# Patient Record
Sex: Female | Born: 1935 | Race: White | Hispanic: No | Smoking: Never smoker
Health system: Southern US, Community
[De-identification: ages and names within clinical notes are randomized; demographics above are authoritative.]

## PROBLEM LIST (undated history)

## (undated) DIAGNOSIS — I10 Essential (primary) hypertension: Secondary | ICD-10-CM

---

## 2004-07-10 ENCOUNTER — Ambulatory Visit: Payer: Self-pay | Admitting: Internal Medicine

## 2005-07-10 ENCOUNTER — Ambulatory Visit: Payer: Self-pay | Admitting: Internal Medicine

## 2005-07-16 ENCOUNTER — Ambulatory Visit: Payer: Self-pay | Admitting: Internal Medicine

## 2005-07-18 ENCOUNTER — Ambulatory Visit: Payer: Self-pay | Admitting: Internal Medicine

## 2006-10-10 ENCOUNTER — Ambulatory Visit: Payer: Self-pay | Admitting: Internal Medicine

## 2007-06-12 HISTORY — PX: REVISION TOTAL HIP ARTHROPLASTY: SHX766

## 2007-07-03 ENCOUNTER — Ambulatory Visit: Payer: Self-pay | Admitting: Internal Medicine

## 2007-07-08 ENCOUNTER — Ambulatory Visit: Payer: Self-pay | Admitting: Internal Medicine

## 2008-04-13 ENCOUNTER — Ambulatory Visit: Payer: Self-pay | Admitting: Family Medicine

## 2008-07-05 ENCOUNTER — Ambulatory Visit: Payer: Self-pay | Admitting: Internal Medicine

## 2009-08-25 ENCOUNTER — Ambulatory Visit: Payer: Self-pay | Admitting: Internal Medicine

## 2011-12-23 ENCOUNTER — Inpatient Hospital Stay: Payer: Self-pay | Admitting: Physician Assistant

## 2011-12-23 ENCOUNTER — Ambulatory Visit: Payer: Self-pay | Admitting: Unknown Physician Specialty

## 2011-12-23 LAB — CBC WITH DIFFERENTIAL/PLATELET
Basophil #: 0.1 10*3/uL (ref 0.0–0.1)
Basophil %: 0.9 %
Eosinophil #: 0.1 10*3/uL (ref 0.0–0.7)
Eosinophil %: 0.8 %
HGB: 13 g/dL (ref 12.0–16.0)
Lymphocyte %: 12.6 %
MCH: 30.7 pg (ref 26.0–34.0)
MCHC: 33.4 g/dL (ref 32.0–36.0)
MCV: 92 fL (ref 80–100)
Monocyte #: 0.5 x10 3/mm (ref 0.2–0.9)
Neutrophil #: 5.4 10*3/uL (ref 1.4–6.5)
Neutrophil %: 78.5 %
RBC: 4.24 10*6/uL (ref 3.80–5.20)

## 2011-12-23 LAB — BASIC METABOLIC PANEL WITH GFR
Anion Gap: 10
BUN: 20 mg/dL — ABNORMAL HIGH
Calcium, Total: 9.1 mg/dL
Chloride: 100 mmol/L
Co2: 27 mmol/L
Creatinine: 1.01 mg/dL
EGFR (African American): >60
EGFR (Non-African Amer.): 52 — ABNORMAL LOW
Glucose: 128 mg/dL — ABNORMAL HIGH
Osmolality: 278
Potassium: 3.7 mmol/L
Sodium: 137 mmol/L

## 2011-12-24 LAB — HEMOGLOBIN: HGB: 10.4 g/dL — ABNORMAL LOW (ref 12.0–16.0)

## 2011-12-25 LAB — BASIC METABOLIC PANEL
BUN: 17 mg/dL (ref 7–18)
Calcium, Total: 7.7 mg/dL — ABNORMAL LOW (ref 8.5–10.1)
Chloride: 103 mmol/L (ref 98–107)
Creatinine: 0.79 mg/dL (ref 0.60–1.30)
EGFR (African American): >60
EGFR (Non-African Amer.): >60

## 2011-12-25 LAB — HEMOGLOBIN: HGB: 9.3 g/dL — ABNORMAL LOW (ref 12.0–16.0)

## 2011-12-26 LAB — URINALYSIS, COMPLETE
Glucose,UR: NEGATIVE mg/dL (ref 0–75)
Nitrite: NEGATIVE
Protein: 30
RBC,UR: 2 /HPF (ref 0–5)
WBC UR: 4 /HPF (ref 0–5)

## 2011-12-26 LAB — HEMOGLOBIN: HGB: 9.5 g/dL — ABNORMAL LOW (ref 12.0–16.0)

## 2011-12-26 LAB — PATHOLOGY REPORT

## 2012-02-14 ENCOUNTER — Ambulatory Visit: Payer: Medicare Other | Admitting: Family Medicine

## 2013-10-19 ENCOUNTER — Ambulatory Visit: Payer: Self-pay | Admitting: Unknown Physician Specialty

## 2014-04-12 IMAGING — CR PELVIS - 1-2 VIEW
1 series · 1 of 1 positions shown · non-contrast
Comparison: none

REASON FOR EXAM: fall
COMMENTS:

PROCEDURE:     DXR - DXR PELVIS AP ONLY  - December 23, 2011  [DATE]
RESULT:
An impacted subcapital femoral neck fracture is identified. Distal fracture
fragment demonstrates superior displacement.

[ap]
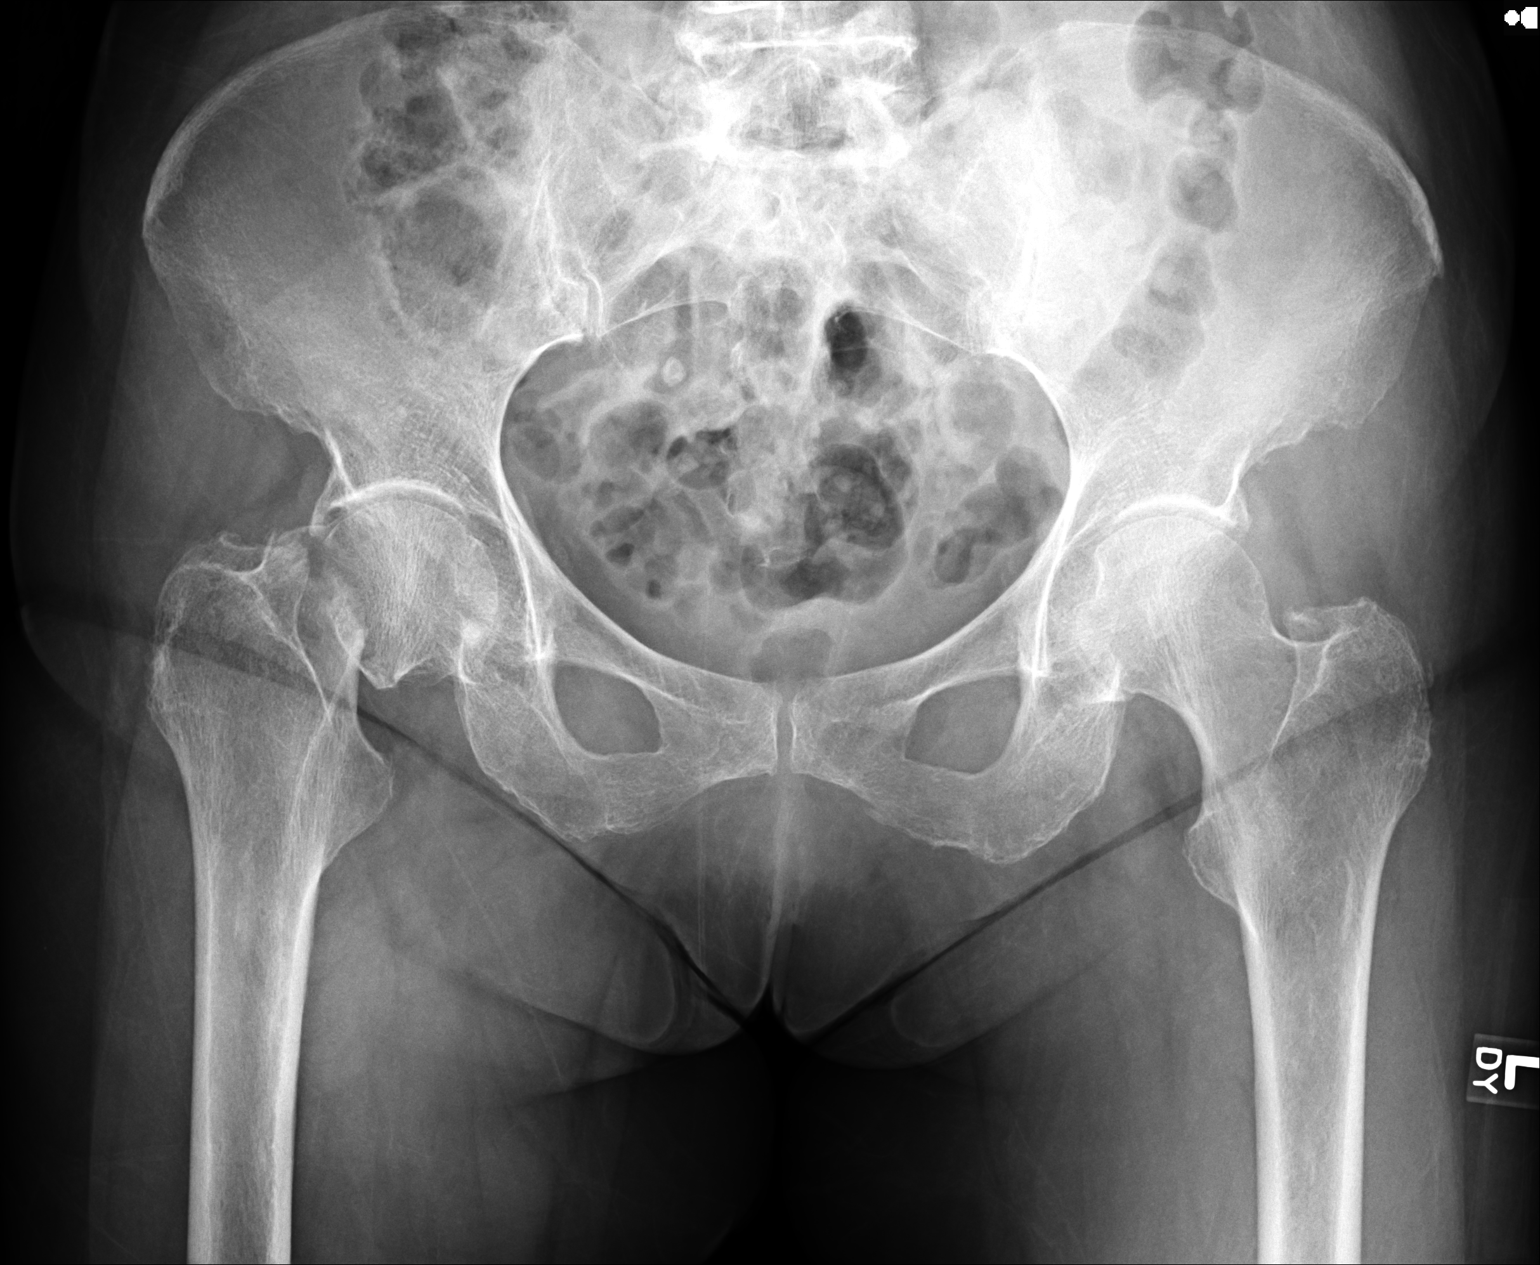

[1 of 1 positions shown; findings below may reference images not displayed]

IMPRESSION: Impacted subcapital femoral neck fracture on the right.

## 2014-04-12 IMAGING — CR DG CHEST 1V
1 series · 1 of 1 positions shown · non-contrast
Comparison: none

REASON FOR EXAM: fracture
COMMENTS:

PROCEDURE:     DXR - DXR CHEST 1 VIEWAP OR PA  - December 23, 2011  [DATE]
RESULT:

[ap]
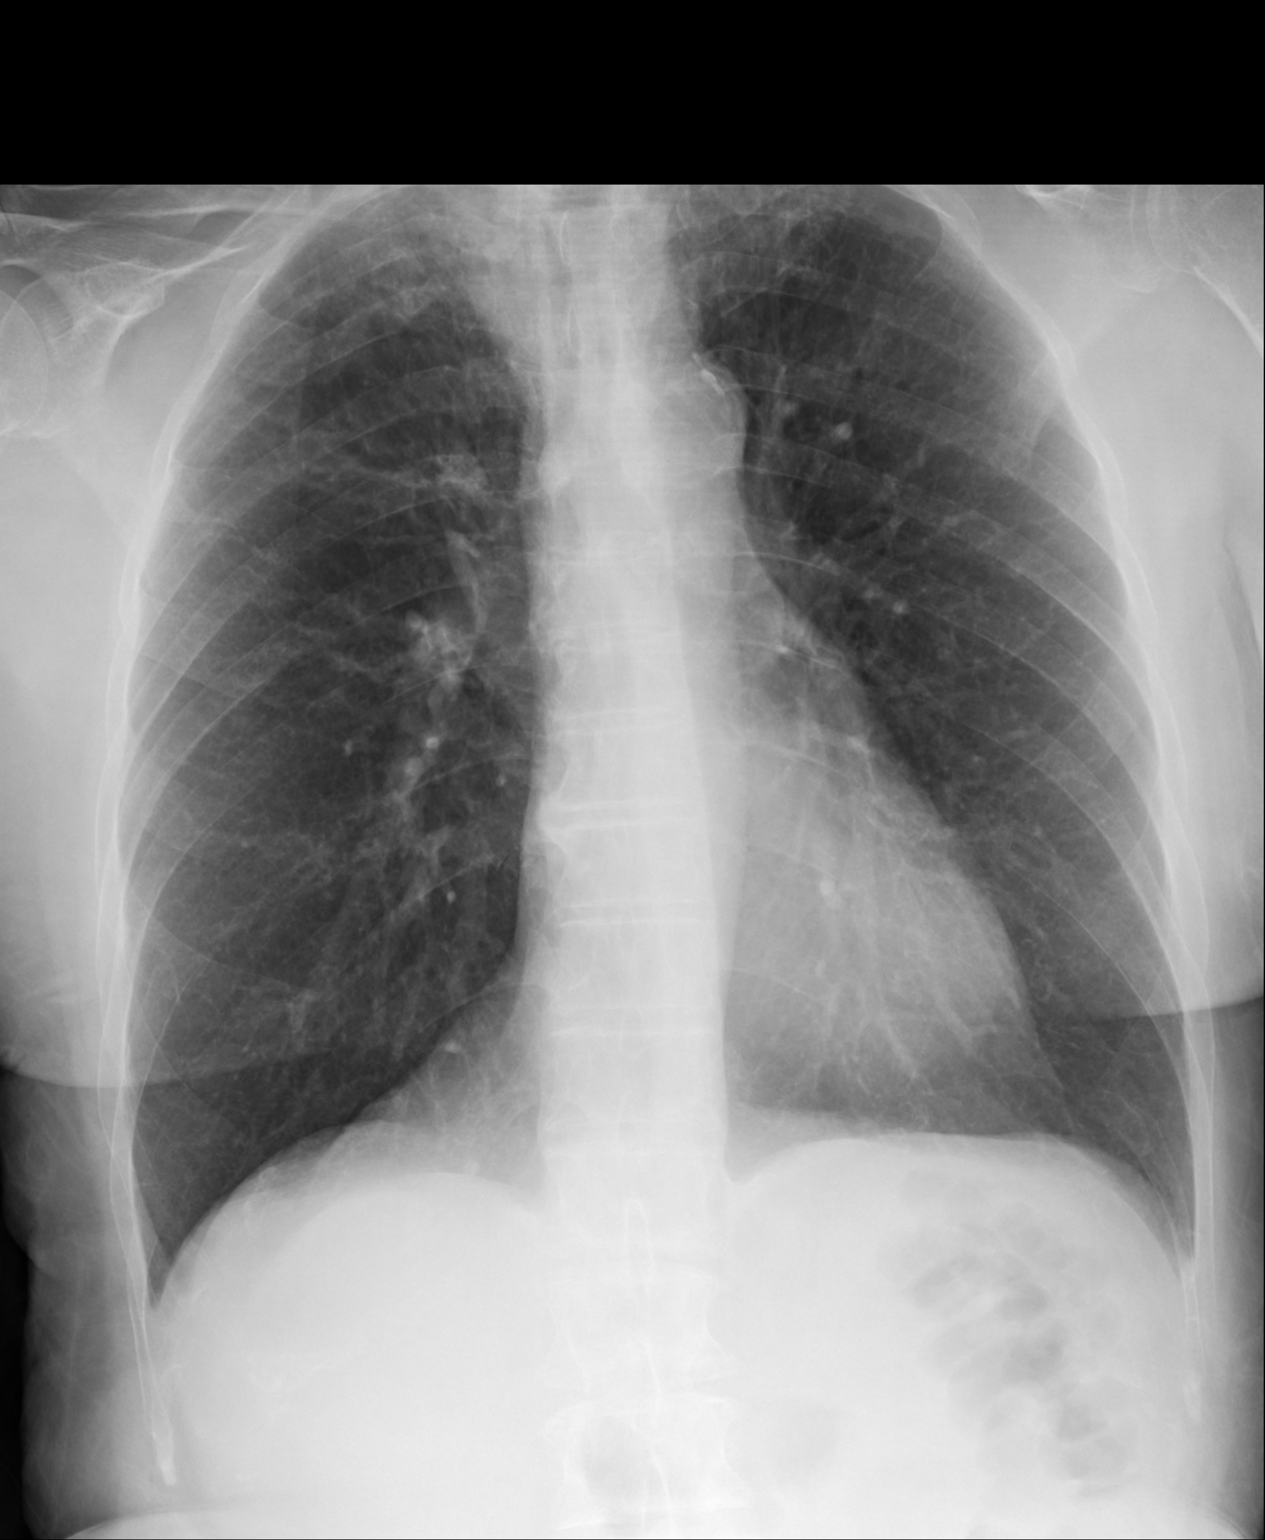

[1 of 1 positions shown; findings below may reference images not displayed]

FINDINGS: The lungs are hyperinflated. There is no evidence of focal
infiltrates, effusions or edema. There is prominence of the interstitial
markings. The cardiac silhouette and visualized bony skeleton are
unremarkable.
IMPRESSION: COPD with likely a component of underlying pulmonary
fibrosis. No focal regions of consolidation or focal infiltrates.

## 2014-09-28 NOTE — Discharge Summary (Signed)
PATIENT NAME:  Nancy Joyce, Nancy Joyce MR#:  [id number] DATE OF BIRTH:  04-Feb-1927  DATE OF ADMISSION:  12/23/2011 DATE OF DISCHARGE:  12/26/2011  ADMITTING DIAGNOSIS: Right hip femoral neck fracture.   DISCHARGE DIAGNOSES:  1. Right hip femoral neck fracture status post hemiarthroplasty.  2. Acute postoperative blood loss anemia, stable.  3. Elevated temperature.  4. Hypertension.  5. Hyperglycemia, possibly reactive.   ATTENDING: Ruthann CancerJames Califf, MD, locum tenens. Care was transferred to Dr. Rosita KeaMenz who was on call for the weekend.   CONSULTING PHYSICIANS:  1. Dr. Lum BabeSangeeta Panwar   2. Dr. Delfino LovettVipul Shah   PROCEDURE: On July 14th the patient underwent right hip hemiarthroplasty by Dr. Gerrit Heckaliff    ANESTHESIA: Spinal.   ESTIMATED BLOOD LOSS: 150 mL.  FLUIDS REPLACED: Crystalloid 1200 mL.   SPECIMEN: Femoral head was sent as a specimen.   IMPLANTS: Stryker.   COMPLICATIONS: No complications occurred.   PATIENT HISTORY: Ms. Nancy Joyce is an [age] year old who fell backwards injuring her right hip. There were no other injuries or loss of consciousness. X-rays had revealed right femoral neck fracture.   PAST MEDICAL HISTORY:  1. Coronary artery disease. 2. Gastroesophageal reflux disease.  3. Chronic cough.  PRIMARY CARE PHYSICIAN: Dr. Gavin PottersGrandis   ALLERGIES: No known allergies.   PHYSICAL EXAMINATION: CARDIAC: Regular rate. RESPIRATORY: Clear breath sounds. EXTREMITIES: Right leg is shortened and externally rotated. She has pain in the groin region. PSYCH: Alert and oriented to time, place, and person.   HOSPITAL COURSE: The patient was admitted on July 14th because of aforementioned fracture. Medicine had also been consulted to see her. The patient had been cleared for surgery and underwent right hip hemiarthroplasty without apparent complication on the 14th and was transferred to the PAC-U and then the orthopedic floor in stable condition. At first the patient was groggy but would wake up some. She  did have some burning right leg pain and Lyrica had been ordered which seemed to help. The patient tolerated her diet well while here and did pass some stool. She was treated for DVT prophylaxis with Lovenox injections and TED hose. She would also progress as far as ambulating with physical therapy. Her right hip dressing will be changed while here and there was no report of any sign of infection. Her hemoglobin did dip down into the 9's but was stable at 9.5. Her temperature had been a bit elevated at times but a urinalysis was negative for any bacterial infection. She was not really having any respiratory issues. She had been deemed stable for discharge by Dr. Sherryll BurgerShah on the 17th.   CONDITION AT DISCHARGE: Stable.   DISPOSITION: [facility].   DISCHARGE MEDICATIONS:  1. Dulcolax 10 mg rectal daily as needed for constipation.  2. Milk of Magnesia 30 mL oral twice a day as needed for constipation.  3. Lyrica 75 mg oral twice a day. This may be discontinued in three weeks from July 17th.   4. Lovenox 40 mg subcutaneous injection daily. Stop in two weeks from July 17th. At this point upon stopping start aspirin 81 mg per oral daily.  5. Oxycodone 5 to 10 mg every four hours as needed for pain.  6. Vitamin B Complex with iron and intrinsic factor 1 capsule per oral twice a day with meals.  7. Senokot-S 1 tablet oral twice a day. 8. Maalox suspension 30 mL oral every six hours as needed for heartburn.  9. Tylenol 325 to 650 mg per oral every four hours as  needed for pain.  10. Albuterol/ipratropium SVN 3 mL SVN every six hours as needed for shortness of breath.   DISCHARGE INSTRUCTIONS AND FOLLOW-UP:  1. Please call [facility] for a two week follow-up which should be around July 29th with Thompson Grayer, PA-C for right hip staple removal.  2. Encourage incentive spirometry.  3. Universal skin precautions. 4. Apply exit alarm. 5. Change right hip dressing as needed for  drainage, in sterile fashion.  6. TED hose, knee-high, are to be worn daily for six weeks status post surgery.  7. PT and OT consult. The patient is weightbearing as tolerated on the right leg. Posterior hip precautions are in place. She should not cross her leg over. She should not flex it past about 70 degrees or significantly internally rotate it. She should have a pillow between her legs. 8. Heating pad as needed for low back pain.  9. Regular diet.  10. Fleet's enema as needed.  11. If the patient continues to have low level fevers, please check a chest x-ray. This very well may be negative. She may just be having a little atelectasis or postoperative inflammatory response.   ____________________________ Letta Moynahan. Clyde Canterbury, [place] jrp:drc D: 12/26/2011 17:44:17 ET T: 12/26/2011 18:01:48 ET JOB#: [id number]  cc: Letta Moynahan. Clyde Canterbury, [place], <Dictator> Letta Moynahan Kasheena Sambrano PA ELECTRONICALLY SIGNED 12/28/2011 16:48

## 2014-09-28 NOTE — Consult Note (Signed)
Chief Complaint:   Subjective/Chief Complaint status post rt hip arthroplasty day 1, doing well. some confusion   VITAL SIGNS/ANCILLARY NOTES: **Vital Signs.:   15-Jul-13 15:21   Vital Signs Type Routine   Temperature Temperature (F) 98.4   Celsius 36.8   Temperature Source Oral   Pulse Pulse 87   Respirations Respirations 16   Systolic BP Systolic BP 129   Diastolic BP (mmHg) Diastolic BP (mmHg) 65   Mean BP 86   Pulse Ox % Pulse Ox % 89   Brief Assessment:   Cardiac Regular  murmur present    Respiratory normal resp effort    Gastrointestinal Normal   Assessment/Plan:  Assessment/Plan:   Assessment * Elevated blood pressure with no history of hypertension: This could be related to acute distress and pain from the right hip fracture level. on p.r.n. hydralazine and also beta blocker.  * rt hip arthroplasty: day 1, furthur management per ortho * Hyperglycemia, possibly reactive: The patient has a history of diabetes.  * Mild dehydration with elevated BUN: The patient is receiving fluids currently.  * History of gastroesophageal reflux disease: on PPI therapy.  * History of chronic cough of unclear etiology: The patient has had extensive work-up and is currently not on any treatment. on incentive spirometry and place on p.r.n. nebulizer treatments for any shortness of breath.  * History of coronary artery disease with significant disease in the LAD: The patient has been asymptomatic, denies any history of myocardial infarction. On her last catheterization, her echo was normal. She is only taking a baby aspirin therapy. Aspirin is going to be on hold for surgery and needs to be started once cleared by the surgeon.    Plan time spent: 15 mins   Electronic Signatures: Patricia PesaShah, Gracy Ehly S (MD)  (Signed 15-Jul-13 16:05)  Authored: Chief Complaint, VITAL SIGNS/ANCILLARY NOTES, Brief Assessment, Assessment/Plan   Last Updated: 15-Jul-13 16:05 by Patricia PesaShah, Schylar Wuebker S (MD)

## 2014-09-28 NOTE — Consult Note (Signed)
Chief Complaint:   Subjective/Chief Complaint status post rt hip arthroplasty day 3, doing well. low grade fever, poor appetite   VITAL SIGNS/ANCILLARY NOTES: **Vital Signs.:   17-Jul-13 06:50   Vital Signs Type Q 4hr   Temperature Temperature (F) 100.4   Celsius 38   Temperature Source Oral   Pulse Pulse 90   Respirations Respirations 18   Systolic BP Systolic BP 137   Diastolic BP (mmHg) Diastolic BP (mmHg) 65   Mean BP 89   Pulse Ox % Pulse Ox % 93   Pulse Ox Activity Level  At rest   Oxygen Delivery Room Air/ 21 %   Brief Assessment:   Cardiac Regular  murmur present    Respiratory normal resp effort    Gastrointestinal Normal   Assessment/Plan:  Assessment/Plan:   Assessment * Elevated blood pressure with no history of hypertension: This could be related to acute distress and pain from the right hip fracture level. on p.r.n. hydralazine and also beta blocker.   * fever: low grade, recommend checking urinalysis and treat if any evidence of infection, if neg and still having fever, get cxr - although she doesn't have any URI symptoms.  * Hyperglycemia, possibly reactive: The patient has a history of diabetes.   * Mild dehydration with elevated BUN: stop intravenous fluids now as patient is taking po  * History of gastroesophageal reflux disease: on PPI therapy.  * History of chronic cough of unclear etiology: The patient has had extensive work-up and is currently not on any treatment. on incentive spirometry and place on p.r.n. nebulizer treatments for any shortness of breath.  * History of coronary artery disease with significant disease in the LAD: The patient has been asymptomatic, denies any history of myocardial infarction. On her last catheterization, her echo was normal. She is only taking a baby aspirin therapy at home. Aspirin can be restarted now if ok with surgeon.    Plan medically ok for discharge, please have someone check on fever is she continues to have  it at facility (UA, chest xray) and can be treated with po antibiotic if need. also resume asa if ok with surgeon.  time spent: 15 mins   Electronic Signatures: Patricia PesaShah, Jone Panebianco S (MD)  (Signed 17-Jul-13 12:45)  Authored: Chief Complaint, VITAL SIGNS/ANCILLARY NOTES, Brief Assessment, Assessment/Plan   Last Updated: 17-Jul-13 12:45 by Patricia PesaShah, Bayle Calvo S (MD)

## 2014-09-28 NOTE — Consult Note (Signed)
Chief Complaint:   Subjective/Chief Complaint status post rt hip arthroplasty day 2, doing well. Hemoglobin trending down   VITAL SIGNS/ANCILLARY NOTES: **Vital Signs.:   16-Jul-13 13:53   Vital Signs Type Q 4hr   Temperature Temperature (F) 98.7   Celsius 37   Temperature Source Oral   Pulse Pulse 89   Respirations Respirations 18   Systolic BP Systolic BP 146   Diastolic BP (mmHg) Diastolic BP (mmHg) 68   Mean BP 94   Pulse Ox % Pulse Ox % 94   Pulse Ox Activity Level  At rest   Oxygen Delivery Room Air/ 21 %   Brief Assessment:   Cardiac Regular  murmur present    Respiratory normal resp effort    Gastrointestinal Normal   Assessment/Plan:  Assessment/Plan:   Assessment * Elevated blood pressure with no history of hypertension: This could be related to acute distress and pain from the right hip fracture level. on p.r.n. hydralazine and also beta blocker.  * rt hip arthroplasty: day 2, furthur management per ortho * Hyperglycemia, possibly reactive: The patient has a history of diabetes.  * Mild dehydration with elevated BUN: stop intravenous fluids now as patient is taking po * History of gastroesophageal reflux disease: on PPI therapy.  * History of chronic cough of unclear etiology: The patient has had extensive work-up and is currently not on any treatment. on incentive spirometry and place on p.r.n. nebulizer treatments for any shortness of breath.  * History of coronary artery disease with significant disease in the LAD: The patient has been asymptomatic, denies any history of myocardial infarction. On her last catheterization, her echo was normal. She is only taking a baby aspirin therapy at home. Aspirin can be restarted now if ok with surgeon.    Plan time spent: 15 mins   Electronic Signatures: Patricia PesaShah, Ezelle Surprenant S (MD)  (Signed 16-Jul-13 15:22)  Authored: Chief Complaint, VITAL SIGNS/ANCILLARY NOTES, Brief Assessment, Assessment/Plan   Last Updated: 16-Jul-13 15:22  by Patricia PesaShah, Alcides Nutting S (MD)

## 2014-10-03 NOTE — Consult Note (Signed)
PATIENT NAME:  Nancy Joyce, Nancy Joyce MR#:  [id number] DATE OF BIRTH:  Feb 21, 1927  DATE OF CONSULTATION:  12/23/2011  REFERRING PHYSICIAN:  Winn Jock. Gerrit Heck, MD   CONSULTING PHYSICIAN:  Darrick Meigs, MD PRIMARY CARE PHYSICIAN: Rolm Gala, MD   REASON FOR CONSULTATION: Preop medical evaluation.   HISTORY OF PRESENT ILLNESS: The patient is an 81 year old female with a past medical history of chronic cough, gastroesophageal reflux disease, history of coronary artery disease with LAD disease who was in her usual state of health until this afternoon when she was going up the steps at her sister's house and fell backwards and hit her right leg. She was brought to the Emergency Room and found to have a right hip fracture. Medicine was consulted for preop medical evaluation. As per review of old records, the patient had a catheterization done in 11-07-03 by Dr. Darrold Junker and at that time was found to have significant disease in the LAD. Her ejection fraction at that time was 76%. The patient reports that she was not prescribed any specific therapy, and the only medication that she takes is the baby aspirin. She also had acid reflux and used to take Prilosec but has not taken that for a long period of time. She has had chronic cough and was at one time on inhalers. She also had PFTs which had shown irritable airway disease, possible asthma at one time, however, currently she is asymptomatic and not on any medications. She is very active and does all her activities of daily living, including gardening. She denies ever having a cerebrovascular accident, transient ischemic attack, myocardial infarction or congestive heart failure. She denies any shortness of breath or chest pain. She used to enjoy walking as an exercise; however, due to arthritis in her legs she had to give that up.   ALLERGIES: No known drug allergies.   MEDICATIONS: Aspirin 81 mg daily.   PAST MEDICAL HISTORY:  1. History of coronary artery disease  with LAD disease.  2. Gastroesophageal reflux disease. 3. Chronic cough, unclear technology.   SOCIAL HISTORY: The patient quit smoking more than 15 years ago. She denies any alcohol or drug abuse. Her husband passed away in 2011-11-07. The lives alone. She has two sons who live near by.  FAMILY HISTORY: Mother died at the age of 37 of old age. Father had atherosclerosis. Sister had breast cancer. Brother had leukemia.    REVIEW OF SYSTEMS: CONSTITUTIONAL: Denies any fever, fatigue, weakness. EYES: Denies any blurred or double vision. ENT: Denies any tinnitus, ear pain. RESPIRATORY: Denies any cough, wheezing.  CARDIOVASCULAR: Denies any chest pain or palpitations. GI: Denies any nausea, vomiting, diarrhea or abdominal pain. GU: Denies any dysuria or hematuria. ENDOCRINE: Denies any polyuria or nocturia. HEMATOLOGIC/LYMPHATIC: Denies any easy or easy bruisability. INTEGUMENT: She denies any acne or rash. MUSCULOSKELETAL: Denies any swelling, gout. NEUROLOGICAL: Denies any numbness or weakness. PSYCH: Denies any anxiety or depression.   PHYSICAL EXAMINATION:  VITAL SIGNS: Temperature 98, heart rate 67, respiratory rate 18, blood pressure 192/77, pulse oximetry 96% on room air.   GENERAL: The patient is an elderly, thin-built Caucasian female in some distress due to pain in her right hip.   HEENT: Head: Atraumatic, normocephalic. Eyes: There is no pallor, icterus or cyanosis. Pupils equal accommodation. Extraocular movements intact. ENT: Wet mucous membranes. No oropharyngeal erythema or thrush.   NECK: Supple. No masses. No JVD. No thyromegaly or lymphadenopathy. Chest wall: No tenderness to palpation. Not using accessory muscles of  respiration. No intercostal muscle retractions.   LUNGS: Bilaterally clear to auscultation. No wheezing, rales, or rhonchi.   CARDIOVASCULAR: S1, S2 regular. There is  a systolic murmur. No rubs or gallops.   ABDOMEN: Soft, nontender, nondistended. No guarding, no  rigidity. No organomegaly. Normal bowel sounds.   SKIN: No rashes or lesions.   PERIPHERIES: N pedal edema. 2+ pedal pulses.   MUSCULOSKELETAL: No cyanosis or clubbing.  NEUROLOGIC: Awake, alert, oriented x3. Nonfocal neurological exam. Cranial nerves are grossly intact.   PSYCHIATRIC: Normal mood and affect.   LABORATORY, DIAGNOSTIC AND RADIOLOGICAL DATA:  EKG is normal sinus rhythm with no evidence of ischemic changes.  CBC normal.  Glucose 128, BUN 20, creatinine 1.01, sodium 137, potassium 3.7, chloride 100, CO2 27, calcium 9.1, anion gap 10.0.    ASSESSMENT AND PLAN: An 81 year old female with past medical history of gastroesophageal reflux disease, chronic cough, history of CAD with LAD disease, not on any medications other than aspirin, admitted with left hip fracture status post mechanical fall. Medicine is consulted for preop evaluation.   1. Preoperative evaluation: The patient had catheterization in 2005 which showed LAD disease. The patient is not on any specific treatment and only takes a baby aspirin. She denies any history of cerebrovascular accident, transient ischemic attack, myocardial infarction or congestive heart failure. She is normally very active and does all her activities of daily living and gardening. She denies any shortness of breath or any chest pain. Her EKG is normal sinus rhythm without any ischemic changes. She is at moderate risk for surgery/anesthesia; however, there is no contraindication to proceeding with surgery.  2. Elevated blood pressure with no history of hypertension: This could be related to acute distress and pain from the right hip fracture level. We will place the patient on p.r.n. hydralazine also start on beta blocker. Hopefully the  patient's blood pressure will become better controlled with pain control.  3. Hyperglycemia, possibly reactive: The patient has a history of diabetes.  4. Mild dehydration with elevated BUN: The patient is receiving  fluids currently.  5. History of gastroesophageal reflux disease: We will start the patient on PPI therapy.  6. History of chronic cough of unclear etiology: The patient has had extensive work-up and is currently not on any treatment. We will start incentive spirometry and place on p.r.n. nebulizer treatments for any shortness of breath.  7. History of coronary artery disease with significant disease in the LAD: The patient has been asymptomatic, denies any history of myocardial infarction. On her last catheterization, her echo was normal. She is only taking a baby aspirin therapy. Aspirin is going to be on hold for surgery and needs to be started once cleared by the surgeon. In the meantime, we will give the patient beta blocker.  I reviewed all medical records, discussed with the patient and her son the plan of care and management.   TIME SPENT: 75 minutes.   ____________________________ Darrick MeigsSangeeta Zanyah Lentsch, MD sp:cbb D: 12/23/2011 17:56:03 ET T: 12/24/2011 09:49:52 ET JOB#: [id number]  cc: Darrick MeigsSangeeta Zola Runion, MD, <Dictator> Letitia CaulHeidi M. Grandis, MD Darrick MeigsSANGEETA Yina Riviere MD ELECTRONICALLY SIGNED 12/24/2011 12:14

## 2014-10-03 NOTE — Op Note (Signed)
PATIENT NAME:  Nancy Joyce, [name] MR#:  [id number] DATE OF BIRTH:  06-10-1927  DATE OF PROCEDURE:  12/23/2011  PREOPERATIVE DIAGNOSIS: Displaced femoral neck fracture, right hip.   POSTOPERATIVE DIAGNOSIS: Displaced femoral neck fracture, right hip.   PROCEDURE: Hemiarthroplasty, right hip.   SURGEON: Winn JockJames C. Tashay Bozich, M.D.   ASSISTANT: None.   ANESTHESIA: Spinal.   ESTIMATED BLOOD LOSS: 150 mL.  REPLACEMENT: 1200 mL crystalloid.   DRAINS: None.   COMPLICATIONS: None.   IMPLANTS USED: Stryker ODC #6 cemented femoral component, #4 cement restrictor, +5 neck length, 46 mm Unitrax head, antibiotic-impregnated cement.   BRIEF CLINICAL NOTE AND PATHOLOGY: The patient fell and suffered the above-mentioned fracture. Options, risks, and benefits were discussed. She elected to proceed with operative intervention. At the time of the procedure, there was a complete fracture. The acetabulum was without damage. The patient's bone was of fair quality. No other abnormalities were noted.   DESCRIPTION OF PROCEDURE: Preop antibiotics, adequate spinal anesthesia, left lateral decubitus position, routine prepping and draping. All prominences were well padded. The axillary roll was used. Routine posterior approach was made. Fascia was opened in line with the incision. The sciatic nerve was identified and protected. Charnley retractor was placed. Piriformis was tagged and reflected. The capsule was opened in a T-shape fashion. The proximal femur was delivered. The piriformis area was debrided. The cookie cutter was used followed by the tapered reamers progressing up to size 6. The neck cut was then made. Broaching was then performed up to a size 6. The size 6 broach fit very nicely. The femoral head was then removed. Sizing was performed. Acetabulum was thoroughly irrigated and reinspected.   The femoral canal was then thoroughly irrigated, cement restrictor was placed. Neo-Synephrine soaked sponge was placed,  again the canal was dried. The cement was mixed and placed from distal to proximal and pressurized. The component was inserted maintaining appropriate anteversion. It seated very nicely. It was then impacted, excess cement was removed. Pressure was maintained until the cement hardened. The head and neck was applied. The acetabulum was again inspected. The hip was reduced. It was extremely stable. The capsule was closed with #5 Tycron, piriformis was repaired with the same. The incision was again thoroughly irrigated. The fascia was closed with #2 quill, the subcutaneous was closed with zero quill, and the skin was closed with staples. A soft sterile dressing was applied. Sponge and needle counts were reported as correct prior to and after wound closure. The patient was awakened and taken to the postanesthesia care unit having tolerated the procedure well.  ____________________________ Winn JockJames C. Gerrit Heckaliff, MD jcc:slb D: 12/23/2011 22:58:57 ET T: 12/24/2011 10:34:21 ET JOB#: [id number]  cc: Winn JockJames C. Gerrit Heckaliff, MD, <Dictator> Winn JockJAMES C Bernell Haynie MD ELECTRONICALLY SIGNED 01/27/2012 7:44

## 2014-10-03 NOTE — H&P (Signed)
Subjective/Chief Complaint Right hip fracture    History of Present Illness [place] [address] backwards injured right hip. No other injuries No LOC. Xray revealed femoral neck fracture.    Past History Some CAD    Past Medical Health Coronary Artery Disease    Primary Physician [facility]   Past Med/Surgical Hx:  Chronic Cough; Idiopathic Etiology:   GERD - Esophageal Reflux:   ASCVD/CAD \{70% occlusion\}:   Denies surgical history.:   ALLERGIES:  No Known Allergies:   HOME MEDICATIONS: Medication Instructions Status  Aspir 81 oral enteric coated tablet 1  orally once a day  Active   Family and Social History:   Family History Non-Contributory    Social History positive tobacco (Greater than 1 year), negative ETOH, negative Illicit drugs    Place of Living Home   Review of Systems:   Fever/Chills No    Cough No    Sputum No    Abdominal Pain No    Diarrhea No    Constipation No    Nausea/Vomiting No    SOB/DOE No    Chest Pain No    Dysuria No    Tolerating Diet Yes   Physical Exam:   GEN well developed    HEENT PERRL    NECK supple    RESP clear BS    CARD regular rate    ABD denies tenderness  normal BS    GU foley catheter in place    LYMPH negative neck, negative axillae    EXTR Right leg shortened and externally rotated. Pain at groin    SKIN normal to palpation    NEURO motor/sensory function intact    PSYCH A+O to time, place, person   Lab Results: Routine Chem:  14-Jul-13 16:11    Glucose, Serum  128   BUN  20   Creatinine (comp) 1.01   Sodium, Serum 137   Potassium, Serum 3.7   Chloride, Serum 100   CO2, Serum 27   Calcium (Total), Serum 9.1   Anion Gap 10   Osmolality (calc) 278   eGFR (African American) >60   eGFR (Non-African American)  52 (eGFR values <34m/min/1.73 m2 may be an indication of chronic kidney disease (CKD). Calculated eGFR is useful in patients with stable renal function. The eGFR calculation will not be  reliable in acutely ill patients when serum creatinine is changing rapidly. It is not useful in  patients on dialysis. The eGFR calculation may not be applicable to patients at the low and high extremes of body sizes, pregnant women, and vegetarians.)  Routine Hem:  14-Jul-13 16:11    WBC (CBC) 6.9   RBC (CBC) 4.24   Hemoglobin (CBC) 13.0   Hematocrit (CBC) 38.9   Platelet Count (CBC) 189   MCV 92   MCH 30.7   MCHC 33.4   RDW 13.4   Neutrophil % 78.5   Lymphocyte % 12.6   Monocyte % 7.2   Eosinophil % 0.8   Basophil % 0.9   Neutrophil # 5.4   Lymphocyte #  0.9   Monocyte # 0.5   Eosinophil # 0.1   Basophil # 0.1 (Result(s) reported on 23 Dec 2011 at 05:04PM.)     Assessment/Admission Diagnosis 1. Disolaced right femoral neck fracture 2. Hx CAD with no symptoms. Cleared at moderate risk    Plan Right hip hemiarthroplasty. Discussed risks including, not limited to infection, dislocation, DVT, leg length inequality, need for transfusion, cardiopulmonary event, death.  Site marked. Patient  understands and wishes to proceed   Electronic Signatures: Lynnda Shields (MD)  (Signed 14-Jul-13 18:21)  Authored: CHIEF COMPLAINT and HISTORY, PAST MEDICAL/SURGIAL HISTORY, ALLERGIES, HOME MEDICATIONS, FAMILY AND SOCIAL HISTORY, REVIEW OF SYSTEMS, PHYSICAL EXAM, LABS, ASSESSMENT AND PLAN   Last Updated: 14-Jul-13 18:21 by Lynnda Shields (MD)

## 2015-03-09 ENCOUNTER — Other Ambulatory Visit: Payer: Self-pay | Admitting: Rheumatology

## 2015-03-09 DIAGNOSIS — G118 Other hereditary ataxias: Secondary | ICD-10-CM

## 2015-03-09 DIAGNOSIS — R51 Headache: Secondary | ICD-10-CM

## 2015-03-09 DIAGNOSIS — R519 Headache, unspecified: Secondary | ICD-10-CM

## 2015-03-17 ENCOUNTER — Ambulatory Visit: Payer: Self-pay

## 2015-03-18 ENCOUNTER — Ambulatory Visit
Admission: RE | Admit: 2015-03-18 | Discharge: 2015-03-18 | Disposition: A | Discharge status: Home / Self Care | Payer: Medicare Other | Source: Ambulatory Visit | Attending: Rheumatology | Admitting: Rheumatology

## 2015-03-18 DIAGNOSIS — H538 Other visual disturbances: Secondary | ICD-10-CM | POA: Insufficient documentation

## 2015-03-18 DIAGNOSIS — G118 Other hereditary ataxias: Secondary | ICD-10-CM | POA: Diagnosis present

## 2015-03-18 DIAGNOSIS — R51 Headache: Secondary | ICD-10-CM | POA: Diagnosis present

## 2015-03-18 DIAGNOSIS — R519 Headache, unspecified: Secondary | ICD-10-CM

## 2015-03-18 DIAGNOSIS — I739 Peripheral vascular disease, unspecified: Secondary | ICD-10-CM | POA: Insufficient documentation

## 2023-02-14 ENCOUNTER — Ambulatory Visit
Admission: EM | Admit: 2023-02-14 | Discharge: 2023-02-14 | Disposition: A | Discharge status: Home / Self Care | Payer: Medicare Other | Attending: Emergency Medicine | Admitting: Emergency Medicine

## 2023-02-14 ENCOUNTER — Encounter: Payer: Self-pay | Admitting: *Deleted

## 2023-02-14 DIAGNOSIS — R21 Rash and other nonspecific skin eruption: Secondary | ICD-10-CM | POA: Diagnosis not present

## 2023-02-14 DIAGNOSIS — H6122 Impacted cerumen, left ear: Secondary | ICD-10-CM | POA: Diagnosis not present

## 2023-02-14 HISTORY — DX: Essential (primary) hypertension: I10

## 2023-02-14 MED ORDER — PREDNISONE 20 MG PO TABS: 40.0000 mg | ORAL_TABLET | Freq: Every day | ORAL | 0 refills | Status: AC

## 2023-02-14 MED ORDER — DOXYCYCLINE HYCLATE 100 MG PO CAPS: 100.0000 mg | ORAL_CAPSULE | Freq: Two times a day (BID) | ORAL | 0 refills | Status: AC

## 2023-02-14 MED ORDER — CEPHALEXIN 500 MG PO CAPS: 500.0000 mg | ORAL_CAPSULE | Freq: Four times a day (QID) | ORAL | 0 refills | Status: AC

## 2023-02-14 NOTE — Discharge Instructions (Addendum)
Make sure you call and get that 930 appointment slot tomorrow at [facility] dermatology.  [facility] will also be reaching out to you to see if he would like to schedule an appointment with them..  We are going to try 2 different antibiotics and prednisone.  Take her to the ER if she gets worse.

## 2023-02-14 NOTE — ED Provider Notes (Signed)
HPI  SUBJECTIVE:  Nancy Joyce is a 89 y.o. female who presents with itching on her chin starting 4 days ago.  Daughter-in-law reports an intensely erythematous, pruritic, painful rash with dry, leathery skin in her chest, bilateral arms, lower face starting yesterday.  She states that the patient's skin "feels like plastic".  She reports lower lip swelling.  No tongue swelling, voice changes, fevers, shortness of breath, coughing, wheezing.  No abdominal pain, diarrhea.  No new lotions, soaps, detergents, new foods or recent antibiotics.  She does not take any medications on a regular basis.  She lives by herself.  She tried Ponds cold cream, and the whole tube of hydrocortisone on her chin.  No aggravating or alleviating factors.  She has a past medical history of being hard of hearing and daughter wonders if she has cerumen impaction.  No history of MRSA, chronic kidney disease, allergies, diabetes, anaphylaxis, eczema.  PCP: None.  All history obtained from daughter in law.   Past Medical History:  Diagnosis Date   Hypertension     Past Surgical History:  Procedure Laterality Date   REVISION TOTAL HIP ARTHROPLASTY Right 2009    History reviewed. No pertinent family history.  Social History   Tobacco Use   Smoking status: Never   Smokeless tobacco: Never  Vaping Use   Vaping status: Never Used  Substance Use Topics   Alcohol use: Not Currently   Drug use: Never    No current facility-administered medications for this encounter.  Current Outpatient Medications:    cephALEXin (KEFLEX) 500 MG capsule, Take 1 capsule (500 mg total) by mouth 4 (four) times daily for 5 days., Disp: 20 capsule, Rfl: 0   doxycycline (VIBRAMYCIN) 100 MG capsule, Take 1 capsule (100 mg total) by mouth 2 (two) times daily for 5 days., Disp: 10 capsule, Rfl: 0   predniSONE (DELTASONE) 20 MG tablet, Take 2 tablets (40 mg total) by mouth daily with breakfast for 5 days., Disp: 10 tablet, Rfl:  0  Allergies  Allergen Reactions   Ropinirole Nausea Only   Cetirizine Rash   Montelukast Rash     ROS  As noted in HPI.   Physical Exam  BP 139/61 (BP Location: Right Arm)   Pulse 85   Temp 97.8 F (36.6 C) (Oral)   Ht 5\' 2"  (1.575 m)   Wt 61.8 kg   SpO2 96%   BMI 24.91 kg/m   Constitutional: Well developed, well nourished, no acute distress Eyes:  EOMI, conjunctiva normal bilaterally HENT: Normocephalic, atraumatic,mucus membranes moist.  Hard of hearing.  Airway patent. Left TM obscured with cerumen.  Right EAC, TM normal. Respiratory: Normal inspiratory effort, lungs clear bilaterally. Cardiovascular: Normal rate GI: nondistended skin: Positive leathery, tender blanchable rash over bilateral arms, chest, shoulders, lower face.  Cracked, swollen skin lower lip.  No rash over her abdomen, back or legs.       Tender, blanchable, leathery skin with cracks and yellowish discharge       Musculoskeletal: no deformities Neurologic: Alert & oriented x 3, no focal neuro deficits Psychiatric: Speech and behavior appropriate   ED Course   Medications - No data to display  Orders Placed This Encounter  Procedures   Ear wax removal    L ear    Standing Status:   Standing    Number of Occurrences:   1    No results found for this or any previous visit (from the past 24 hour(s)). No results found.  ED Clinical Impression  1. Rash   2. Hearing loss of left ear due to cerumen impaction      ED Assessment/Plan     Unsure as to the etiology of the rash, but ii is severe enough that I think it warrants emergent dermatology evaluation.  [facility] dermatology.  Was able to get an appointment for the patient at 0930 tomorrow on 9/6.  Daughter-in-law states that she will call and make that appointment.  Daughter-in-law does not think that she would be able to drive patient to [facility] for dermatology evaluation. Sending home with Keflex 500 mg every 6  hours, doxycycline 100 mg p.o. twice daily, prednisone 40 mg daily for 5 days.    I also spoke with Dr. Pilar Plate at [facility] dermatology.  They will reach out to schedule an appointment with the daughter-in-law if patient is unable to make tomorrow's appointment with [facility] dermatology.  Irrigated out left ear.  Reevaluation, TM intact.  Hearing improved.  Offered to set patient up with a PCP prior to discharge, however, daughter-in-law states that the patient is noncompliant and will not go and see a doctor for routine care.  Discussed  MDM, treatment plan, and plan for follow-up with family member.. Discussed sn/sx that should prompt return to the ED. she agrees with plan.  Patient has a new unknown problem with uncertain prognosis.  Spent 45 minutes with patient and patient's daughter, and discussing case/coordinating care with 2 consultants.  Meds ordered this encounter  Medications   cephALEXin (KEFLEX) 500 MG capsule    Sig: Take 1 capsule (500 mg total) by mouth 4 (four) times daily for 5 days.    Dispense:  20 capsule    Refill:  0   doxycycline (VIBRAMYCIN) 100 MG capsule    Sig: Take 1 capsule (100 mg total) by mouth 2 (two) times daily for 5 days.    Dispense:  10 capsule    Refill:  0   predniSONE (DELTASONE) 20 MG tablet    Sig: Take 2 tablets (40 mg total) by mouth daily with breakfast for 5 days.    Dispense:  10 tablet    Refill:  0      *This clinic note was created using Scientist, clinical (histocompatibility and immunogenetics). Therefore, there may be occasional mistakes despite careful proofreading.  ?    Domenick Gong, MD 02/15/23 [phone]

## 2023-02-14 NOTE — ED Triage Notes (Signed)
Patient's daughter in law states rash started on chest 2 weeks ago light and scattered and then cleared up in a few days.  Re-appeared on neck in the past day or so, going up to peri-oral, and both arms, sloughing in left arm fold, itchy.  Used a whole tube of hydrocortisone cream in a few days, tried ponds face cream as well.  No new soaps detergents meds etc

## 2023-03-27 ENCOUNTER — Other Ambulatory Visit: Payer: Self-pay

## 2023-03-27 ENCOUNTER — Inpatient Hospital Stay
Admission: EM | Admit: 2023-03-27 | Discharge: 2023-04-12 | DRG: 065 | Disposition: E | Payer: Medicare Other | Attending: Internal Medicine | Admitting: Internal Medicine

## 2023-03-27 ENCOUNTER — Emergency Department: Payer: Medicare Other

## 2023-03-27 ENCOUNTER — Inpatient Hospital Stay
Admit: 2023-03-27 | Discharge: 2023-03-27 | Disposition: A | Discharge status: Home / Self Care | Payer: Medicare Other | Attending: Internal Medicine

## 2023-03-27 ENCOUNTER — Inpatient Hospital Stay: Payer: Medicare Other

## 2023-03-27 DIAGNOSIS — I6389 Other cerebral infarction: Secondary | ICD-10-CM | POA: Diagnosis not present

## 2023-03-27 DIAGNOSIS — R55 Syncope and collapse: Secondary | ICD-10-CM

## 2023-03-27 DIAGNOSIS — Z515 Encounter for palliative care: Secondary | ICD-10-CM

## 2023-03-27 DIAGNOSIS — Z96641 Presence of right artificial hip joint: Secondary | ICD-10-CM | POA: Diagnosis present

## 2023-03-27 DIAGNOSIS — R4182 Altered mental status, unspecified: Secondary | ICD-10-CM

## 2023-03-27 DIAGNOSIS — R627 Adult failure to thrive: Secondary | ICD-10-CM | POA: Diagnosis present

## 2023-03-27 DIAGNOSIS — I639 Cerebral infarction, unspecified: Secondary | ICD-10-CM | POA: Diagnosis not present

## 2023-03-27 DIAGNOSIS — F03911 Unspecified dementia, unspecified severity, with agitation: Secondary | ICD-10-CM | POA: Diagnosis present

## 2023-03-27 DIAGNOSIS — I63531 Cerebral infarction due to unspecified occlusion or stenosis of right posterior cerebral artery: Secondary | ICD-10-CM | POA: Diagnosis present

## 2023-03-27 DIAGNOSIS — M6282 Rhabdomyolysis: Secondary | ICD-10-CM | POA: Diagnosis present

## 2023-03-27 DIAGNOSIS — R413 Other amnesia: Secondary | ICD-10-CM | POA: Insufficient documentation

## 2023-03-27 DIAGNOSIS — K922 Gastrointestinal hemorrhage, unspecified: Secondary | ICD-10-CM | POA: Diagnosis not present

## 2023-03-27 DIAGNOSIS — Z66 Do not resuscitate: Secondary | ICD-10-CM | POA: Diagnosis present

## 2023-03-27 DIAGNOSIS — F05 Delirium due to known physiological condition: Secondary | ICD-10-CM | POA: Insufficient documentation

## 2023-03-27 DIAGNOSIS — R29714 NIHSS score 14: Secondary | ICD-10-CM | POA: Diagnosis present

## 2023-03-27 DIAGNOSIS — I2489 Other forms of acute ischemic heart disease: Secondary | ICD-10-CM | POA: Diagnosis present

## 2023-03-27 DIAGNOSIS — Y93E1 Activity, personal bathing and showering: Secondary | ICD-10-CM

## 2023-03-27 DIAGNOSIS — Z87891 Personal history of nicotine dependence: Secondary | ICD-10-CM

## 2023-03-27 DIAGNOSIS — I10 Essential (primary) hypertension: Secondary | ICD-10-CM | POA: Diagnosis present

## 2023-03-27 DIAGNOSIS — R578 Other shock: Secondary | ICD-10-CM

## 2023-03-27 DIAGNOSIS — R7989 Other specified abnormal findings of blood chemistry: Secondary | ICD-10-CM | POA: Insufficient documentation

## 2023-03-27 DIAGNOSIS — Z833 Family history of diabetes mellitus: Secondary | ICD-10-CM | POA: Diagnosis not present

## 2023-03-27 DIAGNOSIS — K644 Residual hemorrhoidal skin tags: Secondary | ICD-10-CM | POA: Diagnosis present

## 2023-03-27 DIAGNOSIS — Z7982 Long term (current) use of aspirin: Secondary | ICD-10-CM | POA: Diagnosis not present

## 2023-03-27 DIAGNOSIS — Y92002 Bathroom of unspecified non-institutional (private) residence single-family (private) house as the place of occurrence of the external cause: Secondary | ICD-10-CM | POA: Diagnosis not present

## 2023-03-27 DIAGNOSIS — W182XXA Fall in (into) shower or empty bathtub, initial encounter: Secondary | ICD-10-CM | POA: Diagnosis present

## 2023-03-27 DIAGNOSIS — W19XXXA Unspecified fall, initial encounter: Secondary | ICD-10-CM

## 2023-03-27 DIAGNOSIS — K219 Gastro-esophageal reflux disease without esophagitis: Secondary | ICD-10-CM | POA: Diagnosis present

## 2023-03-27 DIAGNOSIS — I2510 Atherosclerotic heart disease of native coronary artery without angina pectoris: Secondary | ICD-10-CM | POA: Diagnosis present

## 2023-03-27 LAB — CBC
HCT: 40.9 % (ref 36.0–46.0)
Hemoglobin: 13.7 g/dL (ref 12.0–15.0)
MCH: 29.6 pg (ref 26.0–34.0)
MCHC: 33.5 g/dL (ref 30.0–36.0)
MCV: 88.3 fL (ref 80.0–100.0)
Platelets: 257 10*3/uL (ref 150–400)
RBC: 4.63 MIL/uL (ref 3.87–5.11)
RDW: 14.4 % (ref 11.5–15.5)
WBC: 10.5 10*3/uL (ref 4.0–10.5)
nRBC: 0 % (ref 0.0–0.2)

## 2023-03-27 LAB — URINALYSIS, W/ REFLEX TO CULTURE (INFECTION SUSPECTED)
Bilirubin Urine: NEGATIVE
Glucose, UA: NEGATIVE mg/dL
Hgb urine dipstick: NEGATIVE
Ketones, ur: NEGATIVE mg/dL
Leukocytes,Ua: NEGATIVE
Nitrite: NEGATIVE
Protein, ur: 30 mg/dL — AB
Specific Gravity, Urine: 1.017 (ref 1.005–1.030)
pH: 8 (ref 5.0–8.0)

## 2023-03-27 LAB — DIFFERENTIAL
Abs Immature Granulocytes: 0.03 10*3/uL (ref 0.00–0.07)
Basophils Absolute: 0 10*3/uL (ref 0.0–0.1)
Basophils Relative: 0 %
Eosinophils Absolute: 0.1 10*3/uL (ref 0.0–0.5)
Eosinophils Relative: 1 %
Immature Granulocytes: 0 %
Lymphocytes Relative: 5 %
Lymphs Abs: 0.5 10*3/uL — ABNORMAL LOW (ref 0.7–4.0)
Monocytes Absolute: 0.8 10*3/uL (ref 0.1–1.0)
Monocytes Relative: 8 %
Neutro Abs: 9.1 10*3/uL — ABNORMAL HIGH (ref 1.7–7.7)
Neutrophils Relative %: 86 %

## 2023-03-27 LAB — COMPREHENSIVE METABOLIC PANEL
ALT: 56 U/L — ABNORMAL HIGH (ref 0–44)
AST: 112 U/L — ABNORMAL HIGH (ref 15–41)
Albumin: 3.6 g/dL (ref 3.5–5.0)
Alkaline Phosphatase: 92 U/L (ref 38–126)
Anion gap: 9 (ref 5–15)
BUN: 36 mg/dL — ABNORMAL HIGH (ref 8–23)
CO2: 25 mmol/L (ref 22–32)
Calcium: 9 mg/dL (ref 8.9–10.3)
Chloride: 98 mmol/L (ref 98–111)
Creatinine, Ser: 0.82 mg/dL (ref 0.44–1.00)
GFR, Estimated: >60 mL/min (ref >60)
Glucose, Bld: 133 mg/dL — ABNORMAL HIGH (ref 70–99)
Potassium: 4.2 mmol/L (ref 3.5–5.1)
Sodium: 132 mmol/L — ABNORMAL LOW (ref 135–145)
Total Bilirubin: 1.3 mg/dL — ABNORMAL HIGH (ref 0.3–1.2)
Total Protein: 7.3 g/dL (ref 6.5–8.1)

## 2023-03-27 LAB — PROTIME-INR
INR: 1.1 (ref 0.8–1.2)
Prothrombin Time: 14.1 s (ref 11.4–15.2)

## 2023-03-27 LAB — TROPONIN I (HIGH SENSITIVITY)
Troponin I (High Sensitivity): 138 ng/L (ref <18)
Troponin I (High Sensitivity): 125 ng/L

## 2023-03-27 LAB — CK: Total CK: 2445 U/L — ABNORMAL HIGH (ref 38–234)

## 2023-03-27 LAB — CBG MONITORING, ED: Glucose-Capillary: 120 mg/dL — ABNORMAL HIGH (ref 70–99)

## 2023-03-27 LAB — APTT: aPTT: 27 s (ref 24–36)

## 2023-03-27 LAB — ETHANOL: Alcohol, Ethyl (B): <10 mg/dL (ref <10)

## 2023-03-27 MED ORDER — ACETAMINOPHEN 650 MG RE SUPP: 650.0000 mg | Freq: Four times a day (QID) | RECTAL | Status: AC | PRN

## 2023-03-27 MED ORDER — ONDANSETRON HCL 4 MG/2ML IJ SOLN: 4.0000 mg | Freq: Four times a day (QID) | INTRAMUSCULAR | Status: AC | PRN

## 2023-03-27 MED ORDER — HALOPERIDOL LACTATE 5 MG/ML IJ SOLN
5.0000 mg | Freq: Once | INTRAMUSCULAR | Status: AC
  Administered 2023-03-27: 5 mg via INTRAVENOUS
  Filled 2023-03-27: qty 1

## 2023-03-27 MED ORDER — SODIUM CHLORIDE 0.9 % IV SOLN: INTRAVENOUS | Status: AC

## 2023-03-27 MED ORDER — MELATONIN 5 MG PO TABS: 5.0000 mg | ORAL_TABLET | Freq: Every evening | ORAL | Status: DC | PRN

## 2023-03-27 MED ORDER — HEPARIN SODIUM (PORCINE) 5000 UNIT/ML IJ SOLN: 5000.0000 [IU] | Freq: Three times a day (TID) | INTRAMUSCULAR | Status: DC

## 2023-03-27 MED ORDER — NITROGLYCERIN 2 % TD OINT: 1.0000 [in_us] | TOPICAL_OINTMENT | Freq: Four times a day (QID) | TRANSDERMAL | Status: DC | PRN

## 2023-03-27 MED ORDER — SODIUM CHLORIDE 0.9% FLUSH
3.0000 mL | Freq: Once | INTRAVENOUS | Status: AC
  Administered 2023-03-27: 3 mL via INTRAVENOUS

## 2023-03-27 MED ORDER — LORAZEPAM 2 MG/ML IJ SOLN: 0.5000 mg | Freq: Four times a day (QID) | INTRAMUSCULAR | Status: DC | PRN

## 2023-03-27 MED ORDER — SODIUM CHLORIDE 0.9 % IV BOLUS
1000.0000 mL | Freq: Once | INTRAVENOUS | Status: AC
  Administered 2023-03-27: 1000 mL via INTRAVENOUS

## 2023-03-27 MED ORDER — HALOPERIDOL LACTATE 5 MG/ML IJ SOLN: 5.0000 mg | Freq: Four times a day (QID) | INTRAMUSCULAR | Status: AC | PRN

## 2023-03-27 MED ORDER — ONDANSETRON HCL 4 MG PO TABS: 4.0000 mg | ORAL_TABLET | Freq: Four times a day (QID) | ORAL | Status: AC | PRN

## 2023-03-27 MED ORDER — SENNOSIDES-DOCUSATE SODIUM 8.6-50 MG PO TABS: 1.0000 | ORAL_TABLET | Freq: Every evening | ORAL | Status: DC | PRN

## 2023-03-27 MED ORDER — ACETAMINOPHEN 325 MG PO TABS
650.0000 mg | ORAL_TABLET | Freq: Four times a day (QID) | ORAL | Status: AC | PRN
  Administered 2023-03-29: 650 mg via ORAL
  Filled 2023-03-27 (×2): qty 2

## 2023-03-27 MED ORDER — LORAZEPAM 2 MG/ML IJ SOLN
1.0000 mg | INTRAMUSCULAR | Status: DC | PRN
  Administered 2023-03-27: 1 mg via INTRAVENOUS
  Filled 2023-03-27: qty 1

## 2023-03-27 MED ORDER — HYDRALAZINE HCL 20 MG/ML IJ SOLN: 5.0000 mg | INTRAMUSCULAR | Status: AC | PRN

## 2023-03-27 MED ORDER — MORPHINE SULFATE (PF) 2 MG/ML IV SOLN
2.0000 mg | INTRAVENOUS | Status: AC | PRN
  Administered 2023-03-27: 2 mg via INTRAVENOUS
  Filled 2023-03-27: qty 1

## 2023-03-27 MED ORDER — STROKE: EARLY STAGES OF RECOVERY BOOK: Freq: Once | Status: AC

## 2023-03-27 NOTE — Assessment & Plan Note (Signed)
Suspect secondary to demand ischemia in setting of mild rhabdomyolysis Continue high sensitive troponin Avoid aggressive anticoagulation at this time pending neurology recommendations and evaluation High risk of bleeding with acute stroke

## 2023-03-27 NOTE — Hospital Course (Addendum)
Ms. Nancy Joyce is a 89 year old female with history of hypertension who presents emergency department for chief concerns of unwitnessed fall in the shower.  Unknown as to how long she was laying on the floor.  Initial vitals in the ED showed temperature of 98, respiration rate of 16, heart rate of 71, blood pressure 109/54, SpO2 98% on room air.  Serum sodium is 132, potassium 4.2, chloride 98, bicarb 25, BUN of 36, serum creatinine of 2.82, EGFR greater than 60, nonfasting glucose 133, WBC 10.5, hemoglobin 13.7, platelets of 257.  CK is elevated at 2445.  CT head and cervical spine without contrast: Was read as acute to subacute right PCA infarct.  No acute intracranial hemorrhage.  Moderate chronic small vessel ischemic disease.  No acute cervical spine fracture.  ED treatment: Sodium chloride 1 L bolus.

## 2023-03-27 NOTE — ED Notes (Signed)
Pt stating she needs to void, placed pt on bedpan. Pt confused and not able to urinate after 10 min on bedpan. Removed bedpan and sheets under pt, placed chuck pad and brief on pt with EDT Faith.

## 2023-03-27 NOTE — Assessment & Plan Note (Addendum)
Fall precaution Suspect secondary to PCA infarct Check high sensitive troponin Admit to telemetry cardiac, inpatient

## 2023-03-27 NOTE — ED Triage Notes (Signed)
Pt to ed from home via [facility] for an unwitnessed fall. Pt was found by her family today. They last saw her yesterday. Pt is now more confused to normal. Pt is alert and oriented to self but not date or time. Pt has no obvious deformities noted.

## 2023-03-27 NOTE — Assessment & Plan Note (Addendum)
Patient with increased agitation And attempting to pull lines in bed  Attempting to get out of bed Haldol 5 mg IV one-time dose Ativan 1 mg IV every 4 hours as needed for anxiety, 2 doses ordered Discussed with family/Mr. Charisse Klinefelter (healthcare POA) over the phone at [phone], and family is agreeable to the medications above Safety observation, one-to-one indicated

## 2023-03-27 NOTE — ED Triage Notes (Signed)
First nurse note: Arrived by EMS from home for unwitnessed fall in shower. Unknown downtime. Family found patient. Pressure sore on left side. Right sided hip pain. Per family patient is not acting herself. Baseline is normally A&O x4 and slightly confused now.   History cough and headaches  EMS vitals: 172/84 b/p 76P 96% RA 135CBG 97.6ax

## 2023-03-27 NOTE — Assessment & Plan Note (Signed)
Mild suspect secondary to unwitnessed fall/syncope at home Status post sodium chloride 1 L bolus per EDP Continue sodium chloride infusion at 125 mL/h, 1 day ordered

## 2023-03-27 NOTE — Progress Notes (Signed)
Pt attempted for MRI, meds given prior. Pt confused, agitated and attempting to hit techs. RN called and unable to administer more meds. Pt transported back to room.

## 2023-03-27 NOTE — ED Notes (Signed)
Pt calling out to urinate, asking for bedpan every couple of minutes. Pt will pee very minimal amount say she is finished then ask to pee again. Pt also becoming very agitated pulling off equipment and undressing. Pt cleaned, sheets changed, brief placed.  Dr Sedalia Muta at bedside

## 2023-03-27 NOTE — ED Notes (Signed)
Pt has returned from MRI. 

## 2023-03-27 NOTE — ED Notes (Signed)
Pt brought back to room from MRI. MRI was unsuccessful due to the patient not being able to lay still. Will try again later.

## 2023-03-27 NOTE — ED Notes (Signed)
Patient confused, agitated - attempting to get out of the bed. Pt repositioned, pain addressed. Sitter at the bedside.

## 2023-03-27 NOTE — ED Notes (Signed)
Patient transported to MRI 

## 2023-03-27 NOTE — ED Provider Notes (Addendum)
[facility] Provider Note    Event Date/Time   First MD Initiated Contact with Patient 03/27/23 1541     (approximate)   History   Fall and Altered Mental Status   HPI  Nancy Joyce is a 89 y.o. female past medical history significant for coronary artery disease, GERD, presents emergency department following a fall.  History is provided by the patient's sons who are at bedside.  Patient currently lives by herself at home but is checked on by family members on a daily basis.  States that she has had worsening altered mental status over the past couple of months that acutely worsened over the past 1 week.  States that she has been confusing her days and her nights and has been more confused than her normal.  Found her altered and confused and down on the ground today when they went to check on her.  States that whenever they checked on her yesterday she was confused and thought it was midnight when it was the afternoon.  Does endorse a fall.  Not on anticoagulation.  Patient without any specific complaints.  States that she just wants to be home and left alone.  Family members at bedside, last known well is likely sometime last week.   Physical Exam   Triage Vital Signs: ED Triage Vitals  Encounter Vitals Group     BP 03/27/23 1454 (!) 109/54     Systolic BP Percentile --      Diastolic BP Percentile --      Pulse Rate 03/27/23 1454 71     Resp 03/27/23 1454 16     Temp 03/27/23 1454 98 F (36.7 C)     Temp Source 03/27/23 1454 Oral     SpO2 03/27/23 1454 98 %     Weight --      Height 03/27/23 1455 5\' 2"  (1.575 m)     Head Circumference --      Peak Flow --      Pain Score --      Pain Loc --      Pain Education --      Exclude from Growth Chart --     Most recent vital signs: Vitals:   03/27/23 1454 03/27/23 1730  BP: (!) 109/54 (!) 141/60  Pulse: 71 85  Resp: 16 20  Temp: 98 F (36.7 C)   SpO2: 98% 99%    Physical  Exam Constitutional:      Appearance: She is well-developed.  HENT:     Head: Atraumatic.     Mouth/Throat:     Mouth: Mucous membranes are moist.  Eyes:     Conjunctiva/sclera: Conjunctivae normal.     Pupils: Pupils are equal, round, and reactive to light.  Cardiovascular:     Rate and Rhythm: Regular rhythm.  Pulmonary:     Effort: No respiratory distress.  Abdominal:     General: There is no distension.     Tenderness: There is no abdominal tenderness.  Musculoskeletal:        General: Normal range of motion.     Cervical back: Normal range of motion. No tenderness.     Right lower leg: No edema.     Left lower leg: No edema.     Comments: Ecchymosis to bilateral upper extremities which the family member state or old.  No tenderness to palpation to palpation to bilateral upper or lower extremities.  Full range of motion of bilateral hips.  No tenderness to midline cervical, thoracic or lumbar spine.  Skin:    General: Skin is warm.     Capillary Refill: Capillary refill takes less than 2 seconds.  Neurological:     Mental Status: She is alert. Mental status is at baseline.     IMPRESSION / MDM / ASSESSMENT AND PLAN / ED COURSE  I reviewed the triage vital signs and the nursing notes.  Differential diagnosis including fall, intracranial hemorrhage, fracture, electrolyte abnormality, dehydration, urinary tract infection, delirium, rhabdomyolysis, ACS, dysrhythmia  EKG  I, Corena Herter, the attending physician, personally viewed and interpreted this ECG.   Rate: Normal  Rhythm: Normal sinus  Axis: Normal  Intervals: Normal  ST&T Change: Nonspecific ST changes. Low voltage  No tachycardic or bradycardic dysrhythmias while on cardiac telemetry.  RADIOLOGY I independently reviewed imaging, my interpretation of imaging: CT scan of the head without signs of intracranial hemorrhage or infarction.  CT scan of the cervical spine with no acute fracture or  dislocation.  CT reads pending.   LABS (all labs ordered are listed, but only abnormal results are displayed) Labs interpreted as -    Labs Reviewed  DIFFERENTIAL - Abnormal; Notable for the following components:      Result Value   Neutro Abs 9.1 (*)    Lymphs Abs 0.5 (*)    All other components within normal limits  COMPREHENSIVE METABOLIC PANEL - Abnormal; Notable for the following components:   Sodium 132 (*)    Glucose, Bld 133 (*)    BUN 36 (*)    AST 112 (*)    ALT 56 (*)    Total Bilirubin 1.3 (*)    All other components within normal limits  CK - Abnormal; Notable for the following components:   Total CK 2,445 (*)    All other components within normal limits  URINALYSIS, W/ REFLEX TO CULTURE (INFECTION SUSPECTED) - Abnormal; Notable for the following components:   Color, Urine YELLOW (*)    APPearance CLOUDY (*)    Protein, ur 30 (*)    Bacteria, UA FEW (*)    All other components within normal limits  CBG MONITORING, ED - Abnormal; Notable for the following components:   Glucose-Capillary 120 (*)    All other components within normal limits  TROPONIN I (HIGH SENSITIVITY) - Abnormal; Notable for the following components:   Troponin I (High Sensitivity) 138 (*)    All other components within normal limits  PROTIME-INR  APTT  CBC  ETHANOL  BASIC METABOLIC PANEL  CBC     MDM  Patient presents to the emergency department with altered mental status.  No obvious acute traumatic injury.  CT scan and of the head and neck without acute traumatic injury on my exam but is currently pending.  No leukocytosis.  No significant electrolyte abnormalities.  Patient does have significantly elevated CK concerning for rhabdomyolysis.  Started on IV fluid bolus and infusion.  Consulted hospitalist for admission for rhabdomyolysis and altered mental status.  6:54 PM CT scan read as subacute infarct.  Outside of the window for TNK.  Troponin also significantly elevated.      PROCEDURES:  Critical Care performed: yes  .Critical Care  Performed by: Corena Herter, MD Authorized by: Corena Herter, MD   Critical care provider statement:    Critical care time (minutes):  45   Critical care time was exclusive of:  Separately billable procedures and treating other patients   Critical care was necessary to  treat or prevent imminent or life-threatening deterioration of the following conditions:  CNS failure or compromise and circulatory failure   Critical care was time spent personally by me on the following activities:  Development of treatment plan with patient or surrogate, discussions with consultants, evaluation of patient's response to treatment, examination of patient, ordering and review of laboratory studies, ordering and review of radiographic studies, ordering and performing treatments and interventions, pulse oximetry, re-evaluation of patient's condition and review of old charts   Patient's presentation is most consistent with acute presentation with potential threat to life or bodily function.   MEDICATIONS ORDERED IN ED: Medications  acetaminophen (TYLENOL) tablet 650 mg (has no administration in time range)    Or  acetaminophen (TYLENOL) suppository 650 mg (has no administration in time range)  ondansetron (ZOFRAN) tablet 4 mg (has no administration in time range)    Or  ondansetron (ZOFRAN) injection 4 mg (has no administration in time range)  heparin injection 5,000 Units (has no administration in time range)  senna-docusate (Senokot-S) tablet 1 tablet (has no administration in time range)  melatonin tablet 5 mg (has no administration in time range)  haloperidol lactate (HALDOL) injection 5 mg (has no administration in time range)  morphine (PF) 2 MG/ML injection 2 mg (has no administration in time range)  nitroGLYCERIN (NITROGLYN) 2 % ointment 1 inch (has no administration in time range)  LORazepam (ATIVAN) injection 1 mg (has no  administration in time range)  sodium chloride flush (NS) 0.9 % injection 3 mL (3 mLs Intravenous Given 03/27/23 1504)  sodium chloride 0.9 % bolus 1,000 mL (0 mLs Intravenous Stopped 03/27/23 1832)    FINAL CLINICAL IMPRESSION(S) / ED DIAGNOSES   Final diagnoses:  Non-traumatic rhabdomyolysis  Fall, initial encounter  Altered mental status, unspecified altered mental status type     Rx / DC Orders   ED Discharge Orders     None        Note:  This document was prepared using Dragon voice recognition software and may include unintentional dictation errors.   Corena Herter, MD 03/27/23 1743    Corena Herter, MD 03/27/23 1610    Corena Herter, MD 03/27/23 [phone]

## 2023-03-27 NOTE — Assessment & Plan Note (Addendum)
Neurology has been consulted via secure chat and Epic order and we appreciate further recommendations Complete echo MRI of the brain Fasting lipid and A1c ordered Permissive hypertension: Hydralazine 5 mg IV every 4 hours as needed for SBP greater than 190/110, 1 day ordered Frequent neuro vascular checks PT, OT, SLP Fall precaution

## 2023-03-27 NOTE — Assessment & Plan Note (Addendum)
Per son, has been worsening over the last year Patient likely has undiagnosed dementia Recommended follow-up with outpatient PCP on discharge to patient's son over the phone

## 2023-03-27 NOTE — H&P (Addendum)
History and Physical   Nancy Joyce KGM:[id number] DOB: Oct 13, 1926 DOA: 03/27/2023  PCP: Rolm Gala, MD  Patient coming from: Home via EMS  I have personally briefly reviewed patient's old medical records in [facility] EMR.  Chief Concern: Syncope  HPI: Nancy Joyce is a 89 year old female with history of hypertension who presents emergency department for chief concerns of unwitnessed fall in the shower.  Unknown as to how long she was laying on the floor.  Initial vitals in the ED showed temperature of 98, respiration rate of 16, heart rate of 71, blood pressure 109/54, SpO2 98% on room air.  Serum sodium is 132, potassium 4.2, chloride 98, bicarb 25, BUN of 36, serum creatinine of 2.82, EGFR greater than 60, nonfasting glucose 133, WBC 10.5, hemoglobin 13.7, platelets of 257.  CK is elevated at 2445.  CT head and cervical spine without contrast: Was read as acute to subacute right PCA infarct.  No acute intracranial hemorrhage.  Moderate chronic small vessel ischemic disease.  No acute cervical spine fracture.  ED treatment: Sodium chloride 1 L bolus. ---------------------------- At bedside, patient awake and not alert to self, age, location, current calendar year.  Patient appears to be sundowning.  Patient is agitated and confused and yelling at staff and myself.  Social history: At home on her own.  Son denies history of tobacco, EtOH, recreational drug use.  She formally smoked cigarettes probably more than 50 years ago.  Patient is retired.  ROS: Unable to complete due to acuity of presentation  ED Course: Discussed with emergency medicine provider, patient requiring hospitalization for chief concerns of syncope.  Assessment/Plan  Principal Problem:   Syncope Active Problems:   Rhabdomyolysis   Elevated troponin   Acute right PCA stroke (HCC)   Sundowning   Memory impairment   Assessment and Plan:  * Syncope Fall precaution Suspect secondary to  PCA infarct Check high sensitive troponin Admit to telemetry cardiac, inpatient  Memory impairment Per son, has been worsening over the last year Patient likely has undiagnosed dementia Recommended follow-up with outpatient PCP on discharge to patient's son over the phone  Sundowning Patient with increased agitation And attempting to pull lines in bed  Attempting to get out of bed Haldol 5 mg IV one-time dose Ativan 1 mg IV every 4 hours as needed for anxiety, 2 doses ordered Discussed with family/Mr. Charisse Klinefelter (healthcare POA) over the phone at [phone], and family is agreeable to the medications above Safety observation, one-to-one indicated  Acute right PCA stroke [facility]) Neurology has been consulted via secure chat and Epic order and we appreciate further recommendations Complete echo MRI of the brain Fasting lipid and A1c ordered Permissive hypertension: Hydralazine 5 mg IV every 4 hours as needed for SBP greater than 190/110, 1 day ordered Frequent neuro vascular checks PT, OT, SLP Fall precaution  Elevated troponin Suspect secondary to demand ischemia in setting of mild rhabdomyolysis Continue high sensitive troponin Avoid aggressive anticoagulation at this time pending neurology recommendations and evaluation High risk of bleeding with acute stroke  Rhabdomyolysis Mild suspect secondary to unwitnessed fall/syncope at home Status post sodium chloride 1 L bolus per EDP Continue sodium chloride infusion at 125 mL/h, 1 day ordered  Chart reviewed.   DVT prophylaxis: TED hose Code Status: DNR/DNI Diet: Heart healthy Family Communication: Updated son, Charisse Klinefelter at [phone] Disposition Plan: Clinical course Consults called: Neurology Admission status: Telemetry cardiac, inpatient  Past Medical History:  Diagnosis Date   Hypertension  Past Surgical History:  Procedure Laterality Date   REVISION TOTAL HIP ARTHROPLASTY Right 2009   Social  History:  reports that she has never smoked. She has never used smokeless tobacco. She reports that she does not currently use alcohol. She reports that she does not use drugs.  Allergies  Allergen Reactions   Ropinirole Nausea Only   Cetirizine Rash   Montelukast Rash   Family History  Problem Relation Age of Onset   Diabetes Son    Family history: Family history reviewed and not pertinent.  Prior to Admission medications   None per son   Physical Exam: Vitals:   03/27/23 1454 03/27/23 1455 03/27/23 1730  BP: (!) 109/54  (!) 141/60  Pulse: 71  85  Resp: 16  20  Temp: 98 F (36.7 C)    TempSrc: Oral    SpO2: 98%  99%  Height:  5\' 2"  (1.575 m)    Constitutional: appears frail, appropriate to chronological age. Eyes: PERRL, lids and conjunctivae normal ENMT: Mucous membranes are moist. Posterior pharynx clear of any exudate or lesions. Age-appropriate dentition. Hearing appropriate Neck: normal, supple, no masses, no thyromegaly Respiratory: clear to auscultation bilaterally, no wheezing, no crackles. Normal respiratory effort. No accessory muscle use.  Cardiovascular: Regular rate and rhythm, no murmurs / rubs / gallops. No extremity edema. 2+ pedal pulses. No carotid bruits.  Abdomen: no tenderness, no masses palpated, no hepatosplenomegaly. Bowel sounds positive.  Musculoskeletal: no clubbing / cyanosis. No joint deformity upper and lower extremities. Good ROM, no contractures, no atrophy. Normal muscle tone.  Skin: no rashes, lesions, ulcers. No induration Neurologic: Sensation appears to be intact.  Unable to assess focal strength as patient refuses to participate in physical exam. Psychiatric: Lacks judgment and insight.  Agitated mood.  Confused  EKG: independently reviewed, showing sinus rhythm with rate of 76, QTc 468  Chest x-ray on Admission: I personally reviewed and I agree with radiologist reading as below.  CT HEAD WO CONTRAST  Result Date:  03/27/2023 CLINICAL DATA:  Neuro deficit, acute, stroke suspected. Unwitnessed fall. Confusion. EXAM: CT HEAD WITHOUT CONTRAST CT CERVICAL SPINE WITHOUT CONTRAST TECHNIQUE: Multidetector CT imaging of the head and cervical spine was performed following the standard protocol without intravenous contrast. Multiplanar CT image reconstructions of the cervical spine were also generated. RADIATION DOSE REDUCTION: This exam was performed according to the departmental dose-optimization program which includes automated exposure control, adjustment of the mA and/or kV according to patient size and/or use of iterative reconstruction technique. COMPARISON:  Head MRI 03/18/2015 FINDINGS: CT HEAD FINDINGS Brain: There is a new region of confluent hypoattenuation involving cortex and white matter spanning 4 cm in the inferior right occipital lobe most consistent with a PCA infarct, likely acute to subacute. No intracranial hemorrhage, mass, midline shift, or extra-axial fluid collection is identified. Patchy hypodensities elsewhere in the cerebral white matter bilaterally are nonspecific but compatible with moderate chronic small vessel ischemic disease, progressive from the prior MRI. There are likely chronic lacunar infarcts in the basal ganglia bilaterally. There is mild cerebral atrophy. Vascular: Calcified atherosclerosis at the skull base. No hyperdense vessel. Skull: No acute fracture or suspicious osseous lesion. Sinuses/Orbits: Chronic right maxillary sinusitis. Clear mastoid air cells. Unremarkable orbits. Other: None. CT CERVICAL SPINE FINDINGS Alignment: Trace anterolisthesis of C4 on C5 and C5 on C6, likely degenerative and facet mediated. Skull base and vertebrae: No acute fracture or suspicious osseous lesion. Soft tissues and spinal canal: No prevertebral fluid or swelling. No visible  canal hematoma. Disc levels: Focally advanced disc degeneration at C6-7 with otherwise mild disc degeneration elsewhere.  Asymmetrically advanced left facet arthrosis from C2-3 through C4-5. Severe left neural foraminal stenosis at C3-4 and C4-5. Upper chest: Biapical pleuroparenchymal lung scarring and calcification. Other: 1.3 cm partially calcified right thyroid nodule with no follow-up imaging recommended. IMPRESSION: 1. Acute to subacute right PCA infarct. 2. No acute intracranial hemorrhage. 3. Moderate chronic small vessel ischemic disease. 4. No acute cervical spine fracture. Electronically Signed   By: Sebastian Ache M.D.   On: 03/27/2023 18:32   CT CERVICAL SPINE WO CONTRAST  Result Date: 03/27/2023 CLINICAL DATA:  Neuro deficit, acute, stroke suspected. Unwitnessed fall. Confusion. EXAM: CT HEAD WITHOUT CONTRAST CT CERVICAL SPINE WITHOUT CONTRAST TECHNIQUE: Multidetector CT imaging of the head and cervical spine was performed following the standard protocol without intravenous contrast. Multiplanar CT image reconstructions of the cervical spine were also generated. RADIATION DOSE REDUCTION: This exam was performed according to the departmental dose-optimization program which includes automated exposure control, adjustment of the mA and/or kV according to patient size and/or use of iterative reconstruction technique. COMPARISON:  Head MRI 03/18/2015 FINDINGS: CT HEAD FINDINGS Brain: There is a new region of confluent hypoattenuation involving cortex and white matter spanning 4 cm in the inferior right occipital lobe most consistent with a PCA infarct, likely acute to subacute. No intracranial hemorrhage, mass, midline shift, or extra-axial fluid collection is identified. Patchy hypodensities elsewhere in the cerebral white matter bilaterally are nonspecific but compatible with moderate chronic small vessel ischemic disease, progressive from the prior MRI. There are likely chronic lacunar infarcts in the basal ganglia bilaterally. There is mild cerebral atrophy. Vascular: Calcified atherosclerosis at the skull base. No  hyperdense vessel. Skull: No acute fracture or suspicious osseous lesion. Sinuses/Orbits: Chronic right maxillary sinusitis. Clear mastoid air cells. Unremarkable orbits. Other: None. CT CERVICAL SPINE FINDINGS Alignment: Trace anterolisthesis of C4 on C5 and C5 on C6, likely degenerative and facet mediated. Skull base and vertebrae: No acute fracture or suspicious osseous lesion. Soft tissues and spinal canal: No prevertebral fluid or swelling. No visible canal hematoma. Disc levels: Focally advanced disc degeneration at C6-7 with otherwise mild disc degeneration elsewhere. Asymmetrically advanced left facet arthrosis from C2-3 through C4-5. Severe left neural foraminal stenosis at C3-4 and C4-5. Upper chest: Biapical pleuroparenchymal lung scarring and calcification. Other: 1.3 cm partially calcified right thyroid nodule with no follow-up imaging recommended. IMPRESSION: 1. Acute to subacute right PCA infarct. 2. No acute intracranial hemorrhage. 3. Moderate chronic small vessel ischemic disease. 4. No acute cervical spine fracture. Electronically Signed   By: Sebastian Ache M.D.   On: 03/27/2023 18:32    Labs on Admission: I have personally reviewed following labs  CBC: Recent Labs  Lab 03/27/23 1459  WBC 10.5  NEUTROABS 9.1*  HGB 13.7  HCT 40.9  MCV 88.3  PLT 257   Basic Metabolic Panel: Recent Labs  Lab 03/27/23 1459  NA 132*  K 4.2  CL 98  CO2 25  GLUCOSE 133*  BUN 36*  CREATININE 0.82  CALCIUM 9.0   GFR: CrCl cannot be calculated (Unknown ideal weight.).  Liver Function Tests: Recent Labs  Lab 03/27/23 1459  AST 112*  ALT 56*  ALKPHOS 92  BILITOT 1.3*  PROT 7.3  ALBUMIN 3.6   Coagulation Profile: Recent Labs  Lab 03/27/23 1459  INR 1.1   Cardiac Enzymes: Recent Labs  Lab 03/27/23 1459  CKTOTAL 2,445*   CBG: Recent Labs  Lab 03/27/23 1459  GLUCAP 120*   Urine analysis:    Component Value Date/Time   COLORURINE YELLOW (A) 03/27/2023 1723    APPEARANCEUR CLOUDY (A) 03/27/2023 1723   APPEARANCEUR Hazy 12/26/2011 1231   LABSPEC 1.017 03/27/2023 1723   LABSPEC 1.017 12/26/2011 1231   PHURINE 8.0 03/27/2023 1723   GLUCOSEU NEGATIVE 03/27/2023 1723   GLUCOSEU Negative 12/26/2011 1231   HGBUR NEGATIVE 03/27/2023 1723   BILIRUBINUR NEGATIVE 03/27/2023 1723   BILIRUBINUR Negative 12/26/2011 1231   KETONESUR NEGATIVE 03/27/2023 1723   PROTEINUR 30 (A) 03/27/2023 1723   NITRITE NEGATIVE 03/27/2023 1723   LEUKOCYTESUR NEGATIVE 03/27/2023 1723   LEUKOCYTESUR Trace 12/26/2011 1231   This document was prepared using Dragon Voice Recognition software and may include unintentional dictation errors.  Dr. Sedalia Muta [facility]  If 7PM-7AM, please contact overnight-coverage provider If 7AM-7PM, please contact day attending provider [email or web address]  03/27/2023, 7:24 PM

## 2023-03-27 NOTE — ED Notes (Signed)
Patient is resting comfortably. EDT at bedside.

## 2023-03-27 NOTE — ED Notes (Addendum)
Family at bedside stating pt "can be a handful at night", family telling this RN "you may need to give her something to calm her down." Pt noted to be pulling at cords and very confused about what is happening. Pt directable but easily forgets what was just said.

## 2023-03-28 ENCOUNTER — Inpatient Hospital Stay: Payer: Medicare Other

## 2023-03-28 ENCOUNTER — Encounter: Payer: Self-pay | Admitting: Internal Medicine

## 2023-03-28 DIAGNOSIS — I6389 Other cerebral infarction: Secondary | ICD-10-CM | POA: Diagnosis not present

## 2023-03-28 DIAGNOSIS — R4182 Altered mental status, unspecified: Secondary | ICD-10-CM

## 2023-03-28 DIAGNOSIS — I639 Cerebral infarction, unspecified: Secondary | ICD-10-CM | POA: Diagnosis not present

## 2023-03-28 LAB — BASIC METABOLIC PANEL
Anion gap: 10 (ref 5–15)
BUN: 32 mg/dL — ABNORMAL HIGH (ref 8–23)
CO2: 23 mmol/L (ref 22–32)
Calcium: 8.9 mg/dL (ref 8.9–10.3)
Chloride: 103 mmol/L (ref 98–111)
Creatinine, Ser: 0.81 mg/dL (ref 0.44–1.00)
GFR, Estimated: >60 mL/min (ref >60)
Glucose, Bld: 145 mg/dL — ABNORMAL HIGH (ref 70–99)
Potassium: 4.1 mmol/L (ref 3.5–5.1)
Sodium: 136 mmol/L (ref 135–145)

## 2023-03-28 LAB — CBC
HCT: 40.5 % (ref 36.0–46.0)
Hemoglobin: 13.7 g/dL (ref 12.0–15.0)
MCH: 29.8 pg (ref 26.0–34.0)
MCHC: 33.8 g/dL (ref 30.0–36.0)
MCV: 88.2 fL (ref 80.0–100.0)
Platelets: 236 10*3/uL (ref 150–400)
RBC: 4.59 MIL/uL (ref 3.87–5.11)
RDW: 14.4 % (ref 11.5–15.5)
WBC: 12.3 10*3/uL — ABNORMAL HIGH (ref 4.0–10.5)
nRBC: 0 % (ref 0.0–0.2)

## 2023-03-28 LAB — LIPID PANEL
Cholesterol: 186 mg/dL (ref 0–200)
HDL: 106 mg/dL (ref >40)
LDL Cholesterol: 66 mg/dL (ref 0–99)
Total CHOL/HDL Ratio: 1.8 {ratio}
Triglycerides: 72 mg/dL (ref <150)
VLDL: 14 mg/dL (ref 0–40)

## 2023-03-28 LAB — HEMOGLOBIN A1C
Hgb A1c MFr Bld: 5.7 % — ABNORMAL HIGH (ref 4.8–5.6)
Mean Plasma Glucose: 116.89 mg/dL

## 2023-03-28 LAB — ECHOCARDIOGRAM COMPLETE
Area-P 1/2: 4.8 cm2
Height: 62 in
S' Lateral: 2 cm

## 2023-03-28 LAB — CK: Total CK: 2571 U/L — ABNORMAL HIGH (ref 38–234)

## 2023-03-28 MED ORDER — ATORVASTATIN CALCIUM 20 MG PO TABS
20.0000 mg | ORAL_TABLET | Freq: Every day | ORAL | Status: DC
  Filled 2023-03-28: qty 1

## 2023-03-28 MED ORDER — HALOPERIDOL LACTATE 5 MG/ML IJ SOLN: 2.0000 mg | Freq: Four times a day (QID) | INTRAMUSCULAR | Status: DC | PRN

## 2023-03-28 MED ORDER — ASPIRIN 81 MG PO TBEC: 81.0000 mg | DELAYED_RELEASE_TABLET | Freq: Every day | ORAL | Status: DC

## 2023-03-28 MED ORDER — IOHEXOL 350 MG/ML SOLN
75.0000 mL | Freq: Once | INTRAVENOUS | Status: AC | PRN
  Administered 2023-03-28: 75 mL via INTRAVENOUS

## 2023-03-28 MED ORDER — ASPIRIN 81 MG PO TBEC
81.0000 mg | DELAYED_RELEASE_TABLET | Freq: Every day | ORAL | Status: DC
  Administered 2023-03-29 – 2023-04-01 (×4): 81 mg via ORAL
  Filled 2023-03-28 (×5): qty 1

## 2023-03-28 MED ORDER — HALOPERIDOL 0.5 MG PO TABS
2.0000 mg | ORAL_TABLET | Freq: Four times a day (QID) | ORAL | Status: DC | PRN
  Filled 2023-03-28 (×2): qty 4

## 2023-03-28 MED ORDER — CLOPIDOGREL BISULFATE 75 MG PO TABS
75.0000 mg | ORAL_TABLET | Freq: Every day | ORAL | Status: DC
  Administered 2023-03-29 – 2023-04-01 (×4): 75 mg via ORAL
  Filled 2023-03-28 (×4): qty 1

## 2023-03-28 NOTE — ED Notes (Addendum)
Will administer PO meds and recheck VS when pt wakes up.

## 2023-03-28 NOTE — Progress Notes (Signed)
Admission profile performed with Etheleen Nicks Daughter in law. Due to pt confusion and sleepiness

## 2023-03-28 NOTE — Progress Notes (Addendum)
SLP Cancellation Note  Patient Details Name: Nancy Joyce MRN: [id number] DOB: 1927-01-02   Cancelled treatment:       Reason Eval/Treat Not Completed: Medical issues which prohibited therapy;Patient's level of consciousness;Patient not medically ready (chart reviewed; consulted NSG and met w/ pt in room)  Pt exhibits poor alerting/responding at this time to safely engage in BSE/oral intake. ST will hold on BSE.  In setting of pt's Baseline Cognitive decline/Dementia(per MD note: "Per son, pt has been worsening over the last year. Patient likely has undiagnosed Dementia.", a cognitive-linguistic evaluation would not be appropriate at this time. Any assessment of Cognitive function/status would be best evaluated post Acuity of illness/hospitalization. Recommend f/u w/ Neurology post D/C. Recommend pt have 24/7 Supervision post D/C d/t Safety concerns in setting of Cognitive decline per MD note.  ST services will f/u w/ pt's status tomorrow for BSE. NSG agreed.      Jerilynn Som, MS, CCC-SLP Speech Language Pathologist Rehab Services; [facility] [phone] (ascom) Nancy Joyce 03/28/2023, 12:15 PM

## 2023-03-28 NOTE — Consult Note (Addendum)
NEURO HOSPITALIST CONSULT NOTE   Requestig physician: Dr. Sedalia Muta  Reason for Consult: Subacute right PCA territory stroke on CT  History obtained from:  Chart     HPI:                                                                                                                                          Nancy Joyce is a 89 y.o. female with a PMHx of HTN who presented via EMS to the ED yesterday afternoon after an unwitnessed fall in the shower with unknown downtime. She was found by her family. On arrival to the ED a pressure sore on the left side and right sided hip pain were noted. The family stated that the patient was not acting herself. At baseline she is alert and oriented x 4. EMS vitals: BP 172/84; HR 76; 96% RA; CBG 135, temp 97.6  Hospitalist team feels that the fall was most likely due to syncope. LKN unknown. CT here reveals a subacute right PCA territory stroke. Neurology was consulted for further evaluation and management.   She is on ASA at home.    Past Medical History:  Diagnosis Date   Hypertension     Past Surgical History:  Procedure Laterality Date   REVISION TOTAL HIP ARTHROPLASTY Right 2009    Family History  Problem Relation Age of Onset   Diabetes Son               Social History:  reports that she has never smoked. She has never used smokeless tobacco. She reports that she does not currently use alcohol. She reports that she does not use drugs.  Allergies  Allergen Reactions   Ropinirole Nausea Only   Cetirizine Rash   Montelukast Rash    MEDICATIONS:                                                                                                                     Prior to Admission:  Medications Prior to Admission  Medication Sig Dispense Refill Last Dose   ASPIRIN 81 PO Take 81 mg by mouth daily.   unknown   famotidine (PEPCID) 20 MG tablet Take 20 mg by mouth 2 (two) times daily.   unknown   losartan (COZAAR) 100  MG  tablet Take 100 mg by mouth daily.   unknown   triamcinolone ointment (KENALOG) 0.1 % Apply 1 Application topically as directed.   unknown   Scheduled:   stroke: early stages of recovery book   Does not apply Once   aspirin EC  81 mg Oral Daily   atorvastatin  20 mg Oral Daily   Continuous:  sodium chloride 125 mL/hr at 2023/04/21 2014     ROS:                                                                                                                                       Unable to obtain a detailed ROS due to AMS.    Blood pressure (!) 143/47, pulse 68, temperature 98.5 F (36.9 C), temperature source Axillary, resp. rate 18, height 5\' 2"  (1.575 m), SpO2 98%.   General Examination:                                                                                                       Physical Exam HEENT- [place]/AT   Lungs- Respirations unlabored Extremities- Warm and well-perfused  Neurological Examination Mental Status: Keeps eyes closed during most of the exam. Mildly irritable. Speech is clear and nondysarthric but sparse. No errors of grammar or syntax noted. Poorly oriented, but was able to say that she might be in the hospital and did correctly identify the state after being given a multiple choice answer format for this question. Able to name her ear, chin and nose. Could not identify her index finger. Able to follow all simple commands.  Cranial Nerves: II: Left sided visual field cut. PERRL. III,IV, VI: No ptosis. EOMI. No nystagmus. V: Reacts to touch bilaterally VII: Smile symmetric VIII: Hearing intact to voice IX,X: No hypophonia or hoarseness XI: Symmetric XII: Midline tongue extension Motor: RUE: 5/5 LUE: 5/5 RLE: 5/5 LLE: 5/5 Sensory: Gross touch intact x 4. Not able to cooperate with more detailed sensory exam.  Deep Tendon Reflexes: 1+ and symmetric bilateral biceps, brachioradialis and patellae Cerebellar: No ataxia with FNF bilaterally Gait:  Deferred    Lab Results: Basic Metabolic Panel: Recent Labs  Lab 04-21-2023 1459 03/28/23 0227  NA 132* 136  K 4.2 4.1  CL 98 103  CO2 25 23  GLUCOSE 133* 145*  BUN 36* 32*  CREATININE 0.82 0.81  CALCIUM 9.0 8.9    CBC: Recent Labs  Lab 2023-04-21 1459 03/28/23 0227  WBC 10.5 12.3*  NEUTROABS 9.1*  --  HGB 13.7 13.7  HCT 40.9 40.5  MCV 88.3 88.2  PLT 257 236    Cardiac Enzymes: Recent Labs  Lab 03/24/2023 1459 03/28/23 0227  CKTOTAL 2,445* 2,571*    Lipid Panel: Recent Labs  Lab 03/28/23 0227  CHOL 186  TRIG 72  HDL 106  CHOLHDL 1.8  VLDL 14  LDLCALC 66    Imaging: MR BRAIN WO CONTRAST  Result Date: 03/28/2023 CLINICAL DATA:  Unwitnessed fall EXAM: MRI HEAD WITHOUT CONTRAST TECHNIQUE: Multiplanar, multiecho pulse sequences of the brain and surrounding structures were obtained without intravenous contrast. COMPARISON:  03/18/2015 MRI head, correlation is also made with 03/14/2023 CT head FINDINGS: Brain: Restricted diffusion with ADC correlate in the medial right occipital lobe (series 5, image 63), which is associated with increased T2 hyperintense signal. Additional punctate focus of diffusion restriction in the left thalamus (series 5, image 69), which is also associated increased T2 hyperintense signal. These are concerning for acute infarcts. No acute hemorrhage, mass, mass effect, or midline shift. No hydrocephalus or extra-axial collection. Normal pituitary and craniocervical junction. No hemosiderin deposition to suggest remote hemorrhage. Confluent T2 hyperintense signal in the periventricular white matter, likely the sequela of moderate to severe chronic small vessel ischemic disease. Vascular: Normal arterial flow voids. Skull and upper cervical spine: Normal marrow signal. Sinuses/Orbits: Clear paranasal sinuses. No acute finding in the orbits. Other: The mastoid air cells are well aerated. IMPRESSION: Acute infarcts in the medial right occipital lobe and  left thalamus. These results will be called to the ordering clinician or representative by the Radiologist Assistant, and communication documented in the PACS or [employer]. Electronically Signed   By: Wiliam Ke M.D.   On: 03/28/2023 02:27   DG Chest Port 1 View  Result Date: 03/12/2023 CLINICAL DATA:  Unwitnessed fall in shower. Family found patient on floor. Right-sided hip pain. Patient not acting like herself. EXAM: PORTABLE CHEST 1 VIEW COMPARISON:  12/23/2011 FINDINGS: Low lung volumes accentuate cardiomediastinal silhouette and pulmonary vascularity. Aortic atherosclerotic calcification. Mild elevation right hemidiaphragm. Bibasilar atelectasis. Otherwise no focal consolidation. No pleural effusion or pneumothorax. No definite displaced rib fractures. IMPRESSION: No active disease. Electronically Signed   By: Minerva Fester M.D.   On: 03/14/2023 21:19   CT HEAD WO CONTRAST  Result Date: 04/09/2023 CLINICAL DATA:  Neuro deficit, acute, stroke suspected. Unwitnessed fall. Confusion. EXAM: CT HEAD WITHOUT CONTRAST CT CERVICAL SPINE WITHOUT CONTRAST TECHNIQUE: Multidetector CT imaging of the head and cervical spine was performed following the standard protocol without intravenous contrast. Multiplanar CT image reconstructions of the cervical spine were also generated. RADIATION DOSE REDUCTION: This exam was performed according to the departmental dose-optimization program which includes automated exposure control, adjustment of the mA and/or kV according to patient size and/or use of iterative reconstruction technique. COMPARISON:  Head MRI 03/18/2015 FINDINGS: CT HEAD FINDINGS Brain: There is a new region of confluent hypoattenuation involving cortex and white matter spanning 4 cm in the inferior right occipital lobe most consistent with a PCA infarct, likely acute to subacute. No intracranial hemorrhage, mass, midline shift, or extra-axial fluid collection is identified. Patchy  hypodensities elsewhere in the cerebral white matter bilaterally are nonspecific but compatible with moderate chronic small vessel ischemic disease, progressive from the prior MRI. There are likely chronic lacunar infarcts in the basal ganglia bilaterally. There is mild cerebral atrophy. Vascular: Calcified atherosclerosis at the skull base. No hyperdense vessel. Skull: No acute fracture or suspicious osseous lesion. Sinuses/Orbits: Chronic right maxillary sinusitis. Clear mastoid  air cells. Unremarkable orbits. Other: None. CT CERVICAL SPINE FINDINGS Alignment: Trace anterolisthesis of C4 on C5 and C5 on C6, likely degenerative and facet mediated. Skull base and vertebrae: No acute fracture or suspicious osseous lesion. Soft tissues and spinal canal: No prevertebral fluid or swelling. No visible canal hematoma. Disc levels: Focally advanced disc degeneration at C6-7 with otherwise mild disc degeneration elsewhere. Asymmetrically advanced left facet arthrosis from C2-3 through C4-5. Severe left neural foraminal stenosis at C3-4 and C4-5. Upper chest: Biapical pleuroparenchymal lung scarring and calcification. Other: 1.3 cm partially calcified right thyroid nodule with no follow-up imaging recommended. IMPRESSION: 1. Acute to subacute right PCA infarct. 2. No acute intracranial hemorrhage. 3. Moderate chronic small vessel ischemic disease. 4. No acute cervical spine fracture. Electronically Signed   By: Sebastian Ache M.D.   On: 03/30/2023 18:32   CT CERVICAL SPINE WO CONTRAST  Result Date: 03/22/2023 CLINICAL DATA:  Neuro deficit, acute, stroke suspected. Unwitnessed fall. Confusion. EXAM: CT HEAD WITHOUT CONTRAST CT CERVICAL SPINE WITHOUT CONTRAST TECHNIQUE: Multidetector CT imaging of the head and cervical spine was performed following the standard protocol without intravenous contrast. Multiplanar CT image reconstructions of the cervical spine were also generated. RADIATION DOSE REDUCTION: This exam was  performed according to the departmental dose-optimization program which includes automated exposure control, adjustment of the mA and/or kV according to patient size and/or use of iterative reconstruction technique. COMPARISON:  Head MRI 03/18/2015 FINDINGS: CT HEAD FINDINGS Brain: There is a new region of confluent hypoattenuation involving cortex and white matter spanning 4 cm in the inferior right occipital lobe most consistent with a PCA infarct, likely acute to subacute. No intracranial hemorrhage, mass, midline shift, or extra-axial fluid collection is identified. Patchy hypodensities elsewhere in the cerebral white matter bilaterally are nonspecific but compatible with moderate chronic small vessel ischemic disease, progressive from the prior MRI. There are likely chronic lacunar infarcts in the basal ganglia bilaterally. There is mild cerebral atrophy. Vascular: Calcified atherosclerosis at the skull base. No hyperdense vessel. Skull: No acute fracture or suspicious osseous lesion. Sinuses/Orbits: Chronic right maxillary sinusitis. Clear mastoid air cells. Unremarkable orbits. Other: None. CT CERVICAL SPINE FINDINGS Alignment: Trace anterolisthesis of C4 on C5 and C5 on C6, likely degenerative and facet mediated. Skull base and vertebrae: No acute fracture or suspicious osseous lesion. Soft tissues and spinal canal: No prevertebral fluid or swelling. No visible canal hematoma. Disc levels: Focally advanced disc degeneration at C6-7 with otherwise mild disc degeneration elsewhere. Asymmetrically advanced left facet arthrosis from C2-3 through C4-5. Severe left neural foraminal stenosis at C3-4 and C4-5. Upper chest: Biapical pleuroparenchymal lung scarring and calcification. Other: 1.3 cm partially calcified right thyroid nodule with no follow-up imaging recommended. IMPRESSION: 1. Acute to subacute right PCA infarct. 2. No acute intracranial hemorrhage. 3. Moderate chronic small vessel ischemic disease. 4.  No acute cervical spine fracture. Electronically Signed   By: Sebastian Ache M.D.   On: 04/09/2023 18:32     Assessment: 89 year old female presenting to the hospital after an unwitnessed fall in the shower, most likely due to syncope. CT head revealed a subacute right PCA infarct. - Exam reveals a disoriented elderly female with a left visual field cut. On exam limited by poor cooperation in the setting of confusion and irritability, no focal limb weakness, limb numbness, facial droop or ataxia is noted.  - CT head: Subacute right PCA infarct. Moderate chronic small vessel ischemic disease.  - CT cervical spine: No acute cervical spine  fracture. - MRI brain: Subacute infarcts in the medial right occipital lobe and left thalamus.  - TTE completed with report pending.  - Labs:  - Normal Cr with elevated BUN. Pattern suggests possible volume depletion, which would point to possible orthostatic syncope as the etiology for her fall.  - Serial CKs elevated at 2445 >> 2571.  - Serial Troponins elevated at 138 >> 125 - Lipid profile is normal - WBC was normal on admission, now elevated to 12.3  - HgbA1c mildly elevated at 5.7 - Stroke risk factors: HTN and advanced age  - Not a candidate for IV thrombolysis or mechanical thrombectomy due to completed stroke on imaging and unknown downtime  - Was on ASA at home and can therefore be considered to have failed ASA monotherapy.   Recommendations: - CTA of head and neck - Cardiac telemetry - PT consult, OT consult, Speech consult - Hold off on atorvastatin for now as CKs are elevated. Most likely due to the fall with possibly an extended downtime, but there is a small possibility that the elevated CK is due to another process. Once CK has normalized, then trauma as the etiology for the increased was most likely, and can consider starting atorvastatin at that time. Recheck CK outpatient.  - Adding Plavix to ASA. Continue DAPT indefinitely.   - Risk factor  modification - Frequent neuro checks - NPO until passes stroke swallow screen - BP management per standard protocol. Out of the permissive HTN time window. - Hydrate well  Addendum: CTA of head and neck:   1. Evolving right occipital lobe and left thalamic infarcts. No hemorrhage. 2. The right PCA is occluded at the origin, in keeping with patients right PCA territory infarct. 3. Moderate to severe nearly occlusive stenosis at the P3/P4 junction of the left PCA. If the patient has new stroke like symptoms that could be localized to either MCA territory a repeat MRI is recommended. 4. Severe stenosis to near occlusion of the bilateral proximal M2 segments. 5. No hemodynamically significant stenosis in the neck.  Addendum: - Stroke work up complete.  - Continue ASA and Plavix indefinitely.  - If CK normalizes, start moderate intensity atorvastatin at 40 mg po every day and repeat CK level in one month - Neurohospitalist service will sign off - Will need outpatient Neurology follow up.   Electronically signed: Dr. Caryl Pina 03/28/2023, 11:38 AM

## 2023-03-28 NOTE — Progress Notes (Signed)
PROGRESS NOTE    Nancy Joyce  ZOX:[id number] DOB: 1926/08/10 DOA: 03/27/2023 PCP: Rolm Gala, MD   Assessment & Plan:   Principal Problem:   Syncope Active Problems:   Rhabdomyolysis   Elevated troponin   Acute right PCA stroke (HCC)   Sundowning   Memory impairment   CVA (cerebral vascular accident) (HCC)  Assessment and Plan:  Acute right PCA CVA: as per CT head & MRI brain. Echo ordered. Continue w/ neuro checks. PT/OT & speech consulted. Allow permissive HTN  Syncope: likely secondary to CVA. Continue on tele.   Memory impairment: has been worsening over the the last year as per pt's son. ?Dementia. Haldol prn    Elevated troponin: likely secondary to demand ischemia.   Rhabdomyolysis: likely secondary to unwitnessed fall/syncope at home. Continue on IVFs.      DVT prophylaxis: SCDs Code Status: DNR Family Communication: discussed pt's care w/ pt's son and answered his questions  Disposition Plan: depends on PT/OT recs   Level of care: Telemetry Medical Status is: Inpatient Remains inpatient appropriate because: severity of illness    Consultants:  Neuro   Procedures:   Antimicrobials:   Subjective: Pt is lethargic   Objective: Vitals:   03/27/23 2115 03/27/23 2304 03/27/23 2320 03/28/23 0406  BP: (!) 128/50  (!) 153/56 123/87  Pulse:  98  95  Resp:  19  19  Temp:  98.2 F (36.8 C)  98.5 F (36.9 C)  TempSrc:  Axillary  Axillary  SpO2:  98%  96%  Height:       No intake or output data in the 24 hours ending 03/28/23 0936 There were no vitals filed for this visit.  Examination:  General exam: Appears lethargic Respiratory system: decreased breath sounds b/l  Cardiovascular system: S1 & S2 b/l. No rubs, gallops or clicks. . Gastrointestinal system: Abdomen is nondistended, soft and nontender. Hypoactive bowel sounds heard. Central nervous system: lethargic  Psychiatry: Judgement and insight appears poor     Data Reviewed: I  have personally reviewed following labs and imaging studies  CBC: Recent Labs  Lab 03/27/23 1459 03/28/23 0227  WBC 10.5 12.3*  NEUTROABS 9.1*  --   HGB 13.7 13.7  HCT 40.9 40.5  MCV 88.3 88.2  PLT 257 236   Basic Metabolic Panel: Recent Labs  Lab 03/27/23 1459 03/28/23 0227  NA 132* 136  K 4.2 4.1  CL 98 103  CO2 25 23  GLUCOSE 133* 145*  BUN 36* 32*  CREATININE 0.82 0.81  CALCIUM 9.0 8.9   GFR: CrCl cannot be calculated (Unknown ideal weight.). Liver Function Tests: Recent Labs  Lab 03/27/23 1459  AST 112*  ALT 56*  ALKPHOS 92  BILITOT 1.3*  PROT 7.3  ALBUMIN 3.6   No results for input(s): "LIPASE", "AMYLASE" in the last 168 hours. No results for input(s): "AMMONIA" in the last 168 hours. Coagulation Profile: Recent Labs  Lab 03/27/23 1459  INR 1.1   Cardiac Enzymes: Recent Labs  Lab 03/27/23 1459  CKTOTAL 2,445*   BNP (last 3 results) No results for input(s): "PROBNP" in the last 8760 hours. HbA1C: No results for input(s): "HGBA1C" in the last 72 hours. CBG: Recent Labs  Lab 03/27/23 1459  GLUCAP 120*   Lipid Profile: Recent Labs    03/28/23 0227  CHOL 186  HDL 106  LDLCALC 66  TRIG 72  CHOLHDL 1.8   Thyroid Function Tests: No results for input(s): "TSH", "T4TOTAL", "FREET4", "T3FREE", "THYROIDAB" in the  last 72 hours. Anemia Panel: No results for input(s): "VITAMINB12", "FOLATE", "FERRITIN", "TIBC", "IRON", "RETICCTPCT" in the last 72 hours. Sepsis Labs: No results for input(s): "PROCALCITON", "LATICACIDVEN" in the last 168 hours.  No results found for this or any previous visit (from the past 240 hour(s)).       Radiology Studies: MR BRAIN WO CONTRAST  Result Date: 03/28/2023 CLINICAL DATA:  Unwitnessed fall EXAM: MRI HEAD WITHOUT CONTRAST TECHNIQUE: Multiplanar, multiecho pulse sequences of the brain and surrounding structures were obtained without intravenous contrast. COMPARISON:  03/18/2015 MRI head, correlation is  also made with 03/27/2023 CT head FINDINGS: Brain: Restricted diffusion with ADC correlate in the medial right occipital lobe (series 5, image 63), which is associated with increased T2 hyperintense signal. Additional punctate focus of diffusion restriction in the left thalamus (series 5, image 69), which is also associated increased T2 hyperintense signal. These are concerning for acute infarcts. No acute hemorrhage, mass, mass effect, or midline shift. No hydrocephalus or extra-axial collection. Normal pituitary and craniocervical junction. No hemosiderin deposition to suggest remote hemorrhage. Confluent T2 hyperintense signal in the periventricular white matter, likely the sequela of moderate to severe chronic small vessel ischemic disease. Vascular: Normal arterial flow voids. Skull and upper cervical spine: Normal marrow signal. Sinuses/Orbits: Clear paranasal sinuses. No acute finding in the orbits. Other: The mastoid air cells are well aerated. IMPRESSION: Acute infarcts in the medial right occipital lobe and left thalamus. These results will be called to the ordering clinician or representative by the Radiologist Assistant, and communication documented in the PACS or [employer]. Electronically Signed   By: Wiliam Ke M.D.   On: 03/28/2023 02:27   DG Chest Port 1 View  Result Date: 03/27/2023 CLINICAL DATA:  Unwitnessed fall in shower. Family found patient on floor. Right-sided hip pain. Patient not acting like herself. EXAM: PORTABLE CHEST 1 VIEW COMPARISON:  12/23/2011 FINDINGS: Low lung volumes accentuate cardiomediastinal silhouette and pulmonary vascularity. Aortic atherosclerotic calcification. Mild elevation right hemidiaphragm. Bibasilar atelectasis. Otherwise no focal consolidation. No pleural effusion or pneumothorax. No definite displaced rib fractures. IMPRESSION: No active disease. Electronically Signed   By: Minerva Fester M.D.   On: 03/27/2023 21:19   CT HEAD WO  CONTRAST  Result Date: 03/27/2023 CLINICAL DATA:  Neuro deficit, acute, stroke suspected. Unwitnessed fall. Confusion. EXAM: CT HEAD WITHOUT CONTRAST CT CERVICAL SPINE WITHOUT CONTRAST TECHNIQUE: Multidetector CT imaging of the head and cervical spine was performed following the standard protocol without intravenous contrast. Multiplanar CT image reconstructions of the cervical spine were also generated. RADIATION DOSE REDUCTION: This exam was performed according to the departmental dose-optimization program which includes automated exposure control, adjustment of the mA and/or kV according to patient size and/or use of iterative reconstruction technique. COMPARISON:  Head MRI 03/18/2015 FINDINGS: CT HEAD FINDINGS Brain: There is a new region of confluent hypoattenuation involving cortex and white matter spanning 4 cm in the inferior right occipital lobe most consistent with a PCA infarct, likely acute to subacute. No intracranial hemorrhage, mass, midline shift, or extra-axial fluid collection is identified. Patchy hypodensities elsewhere in the cerebral white matter bilaterally are nonspecific but compatible with moderate chronic small vessel ischemic disease, progressive from the prior MRI. There are likely chronic lacunar infarcts in the basal ganglia bilaterally. There is mild cerebral atrophy. Vascular: Calcified atherosclerosis at the skull base. No hyperdense vessel. Skull: No acute fracture or suspicious osseous lesion. Sinuses/Orbits: Chronic right maxillary sinusitis. Clear mastoid air cells. Unremarkable orbits. Other: None. CT CERVICAL SPINE  FINDINGS Alignment: Trace anterolisthesis of C4 on C5 and C5 on C6, likely degenerative and facet mediated. Skull base and vertebrae: No acute fracture or suspicious osseous lesion. Soft tissues and spinal canal: No prevertebral fluid or swelling. No visible canal hematoma. Disc levels: Focally advanced disc degeneration at C6-7 with otherwise mild disc  degeneration elsewhere. Asymmetrically advanced left facet arthrosis from C2-3 through C4-5. Severe left neural foraminal stenosis at C3-4 and C4-5. Upper chest: Biapical pleuroparenchymal lung scarring and calcification. Other: 1.3 cm partially calcified right thyroid nodule with no follow-up imaging recommended. IMPRESSION: 1. Acute to subacute right PCA infarct. 2. No acute intracranial hemorrhage. 3. Moderate chronic small vessel ischemic disease. 4. No acute cervical spine fracture. Electronically Signed   By: Sebastian Ache M.D.   On: 03/27/2023 18:32   CT CERVICAL SPINE WO CONTRAST  Result Date: 03/27/2023 CLINICAL DATA:  Neuro deficit, acute, stroke suspected. Unwitnessed fall. Confusion. EXAM: CT HEAD WITHOUT CONTRAST CT CERVICAL SPINE WITHOUT CONTRAST TECHNIQUE: Multidetector CT imaging of the head and cervical spine was performed following the standard protocol without intravenous contrast. Multiplanar CT image reconstructions of the cervical spine were also generated. RADIATION DOSE REDUCTION: This exam was performed according to the departmental dose-optimization program which includes automated exposure control, adjustment of the mA and/or kV according to patient size and/or use of iterative reconstruction technique. COMPARISON:  Head MRI 03/18/2015 FINDINGS: CT HEAD FINDINGS Brain: There is a new region of confluent hypoattenuation involving cortex and white matter spanning 4 cm in the inferior right occipital lobe most consistent with a PCA infarct, likely acute to subacute. No intracranial hemorrhage, mass, midline shift, or extra-axial fluid collection is identified. Patchy hypodensities elsewhere in the cerebral white matter bilaterally are nonspecific but compatible with moderate chronic small vessel ischemic disease, progressive from the prior MRI. There are likely chronic lacunar infarcts in the basal ganglia bilaterally. There is mild cerebral atrophy. Vascular: Calcified atherosclerosis at  the skull base. No hyperdense vessel. Skull: No acute fracture or suspicious osseous lesion. Sinuses/Orbits: Chronic right maxillary sinusitis. Clear mastoid air cells. Unremarkable orbits. Other: None. CT CERVICAL SPINE FINDINGS Alignment: Trace anterolisthesis of C4 on C5 and C5 on C6, likely degenerative and facet mediated. Skull base and vertebrae: No acute fracture or suspicious osseous lesion. Soft tissues and spinal canal: No prevertebral fluid or swelling. No visible canal hematoma. Disc levels: Focally advanced disc degeneration at C6-7 with otherwise mild disc degeneration elsewhere. Asymmetrically advanced left facet arthrosis from C2-3 through C4-5. Severe left neural foraminal stenosis at C3-4 and C4-5. Upper chest: Biapical pleuroparenchymal lung scarring and calcification. Other: 1.3 cm partially calcified right thyroid nodule with no follow-up imaging recommended. IMPRESSION: 1. Acute to subacute right PCA infarct. 2. No acute intracranial hemorrhage. 3. Moderate chronic small vessel ischemic disease. 4. No acute cervical spine fracture. Electronically Signed   By: Sebastian Ache M.D.   On: 03/27/2023 18:32        Scheduled Meds:   stroke: early stages of recovery book   Does not apply Once   aspirin EC  81 mg Oral Daily   atorvastatin  20 mg Oral Daily   Continuous Infusions:  sodium chloride 125 mL/hr at 03/27/23 2014     LOS: 1 day        Charise Killian, MD [facility] Pager [phone]  If 7PM-7AM, please contact night-coverage [email or web address] 03/28/2023, 9:36 AM

## 2023-03-28 NOTE — Evaluation (Addendum)
Physical Therapy Evaluation Patient Details Name: Nancy Joyce MRN: [id number] DOB: Jul 11, 1926 Today's Date: 03/28/2023  History of Present Illness  Pt is a 89 y/o F admitted on 03/27/23 after presenting with c/o unwitnessed fall in the shower, unknown how long pt was lying on the floor. CT showed subacute R PCA infarct. MRI showed acute infarcts in the medial R occipital lobe & L thalamus. Pt is being treated for syncope. PMH: HTN  Clinical Impression  Pt seen for PT evaluation with pt received in bed, restless trying to take mittens off, but eyes closed. Pt lethargic throughout session, requiring encouragement to open eyes to engage with PT, eventually able to for brief amount of time. Pt requires total assist for supine<>sit, max assist +2 for stand pivot to [facility] but total assist to pivot back to bed 2/2 ongoing lethargy. Pt is oriented to age, but limited by lethargy, poor ability to follow commands. No family/friends present to provide PLOF/home set up information. Will continue to follow pt acutely to progress mobility as able. (Mittens donned at end of session.)      If plan is discharge home, recommend the following: Two people to help with walking and/or transfers;Two people to help with bathing/dressing/bathroom;Direct supervision/assist for medications management;Help with stairs or ramp for entrance;Assist for transportation;Assistance with feeding;Assistance with cooking/housework;Direct supervision/assist for financial management;Supervision due to cognitive status   Can travel by private vehicle   No    Equipment Recommendations Other (comment) (defer to next venue)  Recommendations for Other Services       Functional Status Assessment Patient has had a recent decline in their functional status and demonstrates the ability to make significant improvements in function in a reasonable and predictable amount of time.     Precautions / Restrictions Precautions Precautions:  Fall Restrictions Weight Bearing Restrictions: No      Mobility  Bed Mobility Overal bed mobility: Needs Assistance Bed Mobility: Supine to Sit, Sit to Supine     Supine to sit: Total assist Sit to supine: Total assist Total assist for rolling L<>R.        Transfers Overall transfer level: Needs assistance   Transfers: Bed to chair/wheelchair/BSC   Stand pivot transfers: Max assist, +2 physical assistance, Total assist, +2 safety/equipment         General transfer comment: Pt transfers bed>BSC on R with max assist +2 with assistance for hand placement, sequencing, turning. When returning to bed from [facility] pt requires total assist for stand pivot.    Ambulation/Gait                  Stairs            Wheelchair Mobility     Tilt Bed    Modified Rankin (Stroke Patients Only)       Balance Overall balance assessment: Needs assistance Sitting-balance support: Feet supported, No upper extremity supported Sitting balance-Leahy Scale: Fair (can progress to static sitting with supervision)     Standing balance support: During functional activity Standing balance-Leahy Scale: Zero                               Pertinent Vitals/Pain Pain Assessment Pain Assessment: Faces Faces Pain Scale: Hurts a little bit Pain Location: generalized Pain Descriptors / Indicators: Grimacing Pain Intervention(s): Repositioned    Home Living Family/patient expects to be discharged to:: Unsure  Prior Function Prior Level of Function : Patient poor historian/Family not available                     Extremity/Trunk Assessment   Upper Extremity Assessment Upper Extremity Assessment: Difficult to assess due to impaired cognition;Generalized weakness (bruising noted to BUE, skin tear to R wrist 2/2 mitten - nurse made aware)    Lower Extremity Assessment Lower Extremity Assessment: Generalized weakness;Difficult  to assess due to impaired cognition    Cervical / Trunk Assessment Cervical / Trunk Assessment:  (bruising noted to buttocks)  Communication      Cognition Arousal: Lethargic Behavior During Therapy: Restless Overall Cognitive Status: No family/caregiver present to determine baseline cognitive functioning                                 General Comments: Pt lethargic throughout session, requiring max cuing to open eyes & engage with PT. Pt able to recall her age during session. Poor ability to follow simple commands throughout session.        General Comments General comments (skin integrity, edema, etc.): Pt reports need to use restroom, very small continent BM on toilet.    Exercises     Assessment/Plan    PT Assessment Patient needs continued PT services  PT Problem List Decreased strength;Decreased range of motion;Decreased activity tolerance;Decreased balance;Decreased mobility;Decreased knowledge of precautions;Decreased safety awareness;Decreased knowledge of use of DME;Decreased cognition;Decreased skin integrity;Decreased coordination       PT Treatment Interventions DME instruction;Therapeutic exercise;Balance training;Gait training;Stair training;Functional mobility training;Therapeutic activities;Patient/family education;Cognitive remediation;Neuromuscular re-education;Modalities    PT Goals (Current goals can be found in the Care Plan section)  Acute Rehab PT Goals PT Goal Formulation: Patient unable to participate in goal setting Time For Goal Achievement: 04/11/23 Potential to Achieve Goals: Fair    Frequency Min 1X/week     Co-evaluation               AM-PAC PT "6 Clicks" Mobility  Outcome Measure Help needed turning from your back to your side while in a flat bed without using bedrails?: Total Help needed moving from lying on your back to sitting on the side of a flat bed without using bedrails?: Total Help needed moving to and from  a bed to a chair (including a wheelchair)?: Total Help needed standing up from a chair using your arms (e.g., wheelchair or bedside chair)?: Total Help needed to walk in hospital room?: Total Help needed climbing 3-5 steps with a railing? : Total 6 Click Score: 6    End of Session   Activity Tolerance: Patient limited by lethargy Patient left: in bed;with call bell/phone within reach;with bed alarm set;with nursing/sitter in room Nurse Communication: Mobility status PT Visit Diagnosis: Muscle weakness (generalized) (M62.81);Difficulty in walking, not elsewhere classified (R26.2);Unsteadiness on feet (R26.81);Other abnormalities of gait and mobility (R26.89)    Time: 1610-9604 PT Time Calculation (min) (ACUTE ONLY): 14 min   Charges:   PT Evaluation $PT Eval Low Complexity: 1 Low   PT General Charges $$ ACUTE PT VISIT: 1 Visit         Aleda Grana, PT, DPT 03/28/23, 9:46 AM   Sandi Mariscal 03/28/2023, 9:44 AM

## 2023-03-28 NOTE — Evaluation (Signed)
Occupational Therapy Evaluation Patient Details Name: Nancy Joyce MRN: [id number] DOB: 1927-01-22 Today's Date: 03/28/2023   History of Present Illness Pt is a 89 y/o F admitted on 03/27/23 after presenting with c/o unwitnessed fall in the shower, unknown how long pt was lying on the floor. CT showed subacute R PCA infarct. MRI showed acute infarcts in the medial R occipital lobe & L thalamus. Pt is being treated for syncope. PMH: HTN   Clinical Impression   Ms. Putnam received in bed, lethargic, with eyes closed, but able to state she needs to use the bathroom. She requires Max-Total A for supine<>sit, sit<>stand, and EOB<>BSC transfers. Pt requires Max A to retain sitting posture while on BSC, Max A to sip water from a straw. Pt is able to provide her first name only, not otherwise oriented. No information about home set-up/PLOF available, other than note in chart that pt has been living alone. Pt will benefit from ongoing OT while hospitalized.      If plan is discharge home, recommend the following: Two people to help with walking and/or transfers;Two people to help with bathing/dressing/bathroom;Direct supervision/assist for medications management;Help with stairs or ramp for entrance;Assistance with feeding;Assistance with cooking/housework;Direct supervision/assist for financial management;Supervision due to cognitive status;Assist for transportation    Functional Status Assessment  Patient has had a recent decline in their functional status and demonstrates the ability to make significant improvements in function in a reasonable and predictable amount of time.  Equipment Recommendations       Recommendations for Other Services Other (comment) (palliative med/hospice consult)     Precautions / Restrictions Precautions Precautions: Fall Restrictions Weight Bearing Restrictions: No      Mobility Bed Mobility Overal bed mobility: Needs Assistance Bed Mobility: Sit to  Supine, Supine to Sit     Supine to sit: Total assist Sit to supine: Total assist        Transfers Overall transfer level: Needs assistance   Transfers: Bed to chair/wheelchair/BSC   Stand pivot transfers: Total assist, +2 physical assistance, +2 safety/equipment                Balance Overall balance assessment: Needs assistance Sitting-balance support: Feet supported, No upper extremity supported Sitting balance-Leahy Scale: Fair     Standing balance support: During functional activity Standing balance-Leahy Scale: Zero Standing balance comment: Requires Total A for maintaining standing balance while transferring bed<>BSC                           ADL either performed or assessed with clinical judgement   ADL Overall ADL's : Needs assistance/impaired Eating/Feeding: Maximal assistance Eating/Feeding Details (indicate cue type and reason): Pt states she wants water, requires max A to hold cup and place straw in mouth. Requires ongoing cues to suck through straw.                     Toilet Transfer: Maximal assistance;+2 for physical assistance;+2 for safety/equipment;BSC/3in1   Toileting- Clothing Manipulation and Hygiene: Total assistance;Sit to/from stand               Vision         Perception         Praxis         Pertinent Vitals/Pain Pain Assessment Breathing: occasional labored breathing, short period of hyperventilation Negative Vocalization: repeated troubled calling out, loud moaning/groaning, crying Facial Expression: facial grimacing Body Language: rigid, fists clenched, knees up,  pushing/pulling away, strikes out Consolability: unable to console, distract or reassure PAINAD Score: 9 Pain Location: generalized Pain Descriptors / Indicators: Grimacing, Moaning, Crying Pain Intervention(s): Repositioned     Extremity/Trunk Assessment Upper Extremity Assessment Upper Extremity Assessment: Generalized weakness    Lower Extremity Assessment Lower Extremity Assessment: Generalized weakness       Communication Communication Communication: Difficulty communicating thoughts/reduced clarity of speech   Cognition Arousal: Obtunded Behavior During Therapy: Restless, Agitated Overall Cognitive Status: No family/caregiver present to determine baseline cognitive functioning                                 General Comments: Pt largely non-responsive to questions, intermittently able to follow simple, one-step directions. Opens eyes intermittently     General Comments       Exercises     Shoulder Instructions      Home Living Family/patient expects to be discharged to:: Private residence Living Arrangements: Alone Available Help at Discharge: Family;Available PRN/intermittently                             Additional Comments: Per chart review, pt lives alone, with family visiting regularly to assist. Pt is unable to provide an information this date re: her living situation or PLOF      Prior Functioning/Environment Prior Level of Function : Patient poor historian/Family not available                        OT Problem List: Decreased strength;Decreased range of motion;Decreased activity tolerance;Impaired balance (sitting and/or standing);Pain;Decreased cognition;Decreased coordination      OT Treatment/Interventions: Self-care/ADL training;Therapeutic exercise;Patient/family education;Balance training;Therapeutic activities;DME and/or AE instruction;Cognitive remediation/compensation    OT Goals(Current goals can be found in the care plan section) Acute Rehab OT Goals Patient Stated Goal: Pt unable to state Time For Goal Achievement: 04/11/23 Potential to Achieve Goals: Fair ADL Goals Pt Will Perform Eating: with set-up;with min assist Pt Will Perform Grooming: sitting;with set-up;with min assist Additional ADL Goal #1: Pt will transfer supine<>sitting  EOB with Min A Additional ADL Goal #2: Pt will transfer sit<>stand with RW and Mod A  OT Frequency: Min 1X/week    Co-evaluation              AM-PAC OT "6 Clicks" Daily Activity     Outcome Measure Help from another person eating meals?: A Lot Help from another person taking care of personal grooming?: A Lot Help from another person toileting, which includes using toliet, bedpan, or urinal?: A Lot Help from another person bathing (including washing, rinsing, drying)?: Total Help from another person to put on and taking off regular upper body clothing?: A Lot Help from another person to put on and taking off regular lower body clothing?: Total 6 Click Score: 10   End of Session    Activity Tolerance: Patient limited by lethargy;Other (comment) (Pt limited by weakness, imbalance, and reduced cognition) Patient left: in bed;with call bell/phone within reach;with bed alarm set;with nursing/sitter in room  OT Visit Diagnosis: Unsteadiness on feet (R26.81);Muscle weakness (generalized) (M62.81);History of falling (Z91.81);Other symptoms and signs involving cognitive function                Time: 0981-1914 OT Time Calculation (min): 12 min Charges:  OT General Charges $OT Visit: 1 Visit OT Evaluation $OT Eval Moderate Complexity: 1 Mod OT Treatments $  Self Care/Home Management : 8-22 mins Latina Craver, PhD, MS, OTR/L 03/28/23, 3:34 PM

## 2023-03-29 DIAGNOSIS — I6389 Other cerebral infarction: Secondary | ICD-10-CM | POA: Diagnosis not present

## 2023-03-29 LAB — CBC
HCT: 37.9 % (ref 36.0–46.0)
Hemoglobin: 12.6 g/dL (ref 12.0–15.0)
MCH: 29.6 pg (ref 26.0–34.0)
MCHC: 33.2 g/dL (ref 30.0–36.0)
MCV: 89.2 fL (ref 80.0–100.0)
Platelets: 228 10*3/uL (ref 150–400)
RBC: 4.25 MIL/uL (ref 3.87–5.11)
RDW: 14.8 % (ref 11.5–15.5)
WBC: 11.2 10*3/uL — ABNORMAL HIGH (ref 4.0–10.5)
nRBC: 0 % (ref 0.0–0.2)

## 2023-03-29 LAB — BASIC METABOLIC PANEL
Anion gap: 12 (ref 5–15)
BUN: 42 mg/dL — ABNORMAL HIGH (ref 8–23)
CO2: 21 mmol/L — ABNORMAL LOW (ref 22–32)
Calcium: 8.8 mg/dL — ABNORMAL LOW (ref 8.9–10.3)
Chloride: 107 mmol/L (ref 98–111)
Creatinine, Ser: 0.78 mg/dL (ref 0.44–1.00)
GFR, Estimated: >60 mL/min (ref >60)
Glucose, Bld: 98 mg/dL (ref 70–99)
Potassium: 3.4 mmol/L — ABNORMAL LOW (ref 3.5–5.1)
Sodium: 140 mmol/L (ref 135–145)

## 2023-03-29 LAB — CK: Total CK: 1462 U/L — ABNORMAL HIGH (ref 38–234)

## 2023-03-29 MED ORDER — POTASSIUM CHLORIDE CRYS ER 20 MEQ PO TBCR
20.0000 meq | EXTENDED_RELEASE_TABLET | Freq: Once | ORAL | Status: AC
  Administered 2023-03-29: 20 meq via ORAL
  Filled 2023-03-29: qty 1

## 2023-03-29 NOTE — Evaluation (Signed)
Clinical/Bedside Swallow Evaluation Patient Details  Name: Nancy Joyce MRN: [id number] Date of Birth: 05-16-1927  Today's Date: 03/29/2023 Time: SLP Start Time (ACUTE ONLY): 0930 SLP Stop Time (ACUTE ONLY): 1030 SLP Time Calculation (min) (ACUTE ONLY): 60 min  Past Medical History:  Past Medical History:  Diagnosis Date   Hypertension    Past Surgical History:  Past Surgical History:  Procedure Laterality Date   REVISION TOTAL HIP ARTHROPLASTY Right 2009   HPI:  Pt is a 89 y/o F admitted on Apr 11, 2023 after presenting with c/o unwitnessed fall in the shower, unknown how long pt was lying on the floor.   Head CT showed Moderate chronic small vessel ischemic disease; subacute R PCA infarct.    MRI showed acute infarcts in the medial R occipital lobe & L thalamus.  PMH: HTN, undiagnosed Dementia.  Since admit, pt has been restless trying to take mittens off, Confused, yelling at Staff; intermittently lethargic requiring encouragement to open eyes to engage.  MD has stated suspicion of undiagnoses Dementia.   CXR negative.    Assessment / Plan / Recommendation  Clinical Impression   Pt seen for BSE today. Pt awakened, verbal at times and followed basic 1 step commands w/ cue given. She exhibited Mild+ Confusion overall and perseverated on "how did I get here?". Pt lives alone per report. MD suspects undiagnosed Dementia. Pt has Sitter in room; mitts.  Pt on RA, afebrile. WBC 11.2  Pt appears to present w/ risk for oropharyngeal phase dysphagia and aspiration/aspiration pneumonia in setting of declined Cognitive status; suspected Baseline Dementia. Per MD note, "Per son, pt has been worsening over the last year.". ANY  Cognitive decline can impact overall awareness/timing of swallow and safety during po tasks which increases risk for aspiration, choking. Pt's risk for aspiration can be reduced when following general aspiration precautions and using a modified diet consistency of Pureed foods  and Nectar liquids.  She required min-mod verbal/visual cues for follow through during po tasks this eval.        Pt consumed several trials of ice chips, purees, and Nectar liquids via tsp/cup w/ No overt clinical s/s of aspiration noted: no decline in vocal quality; no cough, and no decline in respiratory status during/post trials. O2 sats remained 98%. Oral phase was adequate for bolus management, A-P transfer, and oral clearing of the boluses given. OM Exam appeared grossly [facility] w/ No overt/gross unilateral oral weakness noted during bolus management/acceptance of po trials. Some confusion of OM tasks and oral care noted.          In setting of baseline Cognitive decline and her risk for aspiration, recommend initiation of the dysphagia level 1(PUREED foods moistened for ease of oral phase) w/ Nectar liquids; aspiration precautions; reduce Distractions during meals and engage pt during meals for self-feeding as able. Pills Crushed in Puree for safer swallowing as needed. Support w/ feeding at meals as needed. MD/NSG updated.  ST services recommends follow w/ Palliative Care for GOC and education re: impact of Cognitive decline/Dementia on swallowing. Precautions posted in room. ST services will f/u w/ pt's status while admitted for ongoing assessment and trials to upgrade diet as able.  SLP Visit Diagnosis: Dysphagia, oropharyngeal phase (R13.12) (in setting of Confusion)    Aspiration Risk  Mild aspiration risk;Risk for inadequate nutrition/hydration    Diet Recommendation   Nectar;Dysphagia 1 (puree) = dysphagia level 1(PUREED foods moistened for ease of oral phase) w/ Nectar liquids; aspiration precautions; reduce Distractions during meals  and engage pt during meals for self-feeding as able. Support w/ feeding at meals as needed.  Medication Administration: Crushed with puree    Other  Recommendations Recommended Consults:  (Dietician f/u; Palliative Care f/u) Oral Care Recommendations: Oral  care BID;Oral care before and after PO;Staff/trained caregiver to provide oral care Caregiver Recommendations: Avoid jello, ice cream, thin soups, popsicles;Remove water pitcher;Have oral suction available    Recommendations for follow up therapy are one component of a multi-disciplinary discharge planning process, led by the attending physician.  Recommendations may be updated based on patient status, additional functional criteria and insurance authorization.  Follow up Recommendations Follow physician's recommendations for discharge plan and follow up therapies      Assistance Recommended at Discharge  FULL d/t cognitive decline  Functional Status Assessment Patient has had a recent decline in their functional status and demonstrates the ability to make significant improvements in function in a reasonable and predictable amount of time. (TBD)  Frequency and Duration min 2x/week  2 weeks       Prognosis Prognosis for improved oropharyngeal function: Fair Barriers to Reach Goals: Cognitive deficits;Time post onset;Severity of deficits Barriers/Prognosis Comment: Cognitive decline      Swallow Study   General Date of Onset: 04/01/2023 HPI: Pt is a 89 y/o F admitted on 03/16/2023 after presenting with c/o unwitnessed fall in the shower, unknown how long pt was lying on the floor.   Head CT showed Moderate chronic small vessel ischemic disease; subacute R PCA infarct.    MRI showed acute infarcts in the medial R occipital lobe & L thalamus.  PMH: HTN, undiagnosed Dementia.  Since admit, pt has been restless trying to take mittens off, Confused, yelling at Staff; intermittently lethargic requiring encouragement to open eyes to engage.  MD has stated suspicion of undiagnoses Dementia.   CXR negative. Type of Study: Bedside Swallow Evaluation Previous Swallow Assessment: none Diet Prior to this Study: NPO Temperature Spikes Noted: No (wbc 11.2) Respiratory Status: Room air History of Recent  Intubation: No Behavior/Cognition: Alert;Cooperative;Pleasant mood;Confused;Distractible;Requires cueing Oral Cavity Assessment: Dry Oral Care Completed by SLP: Yes Oral Cavity - Dentition: Adequate natural dentition;Missing dentition Vision:  (n/a) Self-Feeding Abilities: Total assist Patient Positioning: Upright in bed (full support) Baseline Vocal Quality: Normal Volitional Cough: Cognitively unable to elicit Volitional Swallow: Unable to elicit    Oral/Motor/Sensory Function Overall Oral Motor/Sensory Function: Within functional limits (no gross/overt changes)   Ice Chips Ice chips: Within functional limits Presentation: Spoon (fed; 3 trials)   Thin Liquid Thin Liquid: Not tested    Nectar Thick Nectar Thick Liquid: Within functional limits Presentation: Cup;Spoon (fed; 4 trials via cup, 5 trials via spoon) Other Comments: pt declined further   Honey Thick Honey Thick Liquid: Not tested   Puree Puree: Within functional limits Presentation: Spoon (fed; 10 trials)   Solid     Solid: Not tested         Jerilynn Som, MS, CCC-SLP Speech Language Pathologist Rehab Services; [facility] [place] [phone] (ascom) Ladene Allocca 03/29/2023,1:05 PM

## 2023-03-29 NOTE — Progress Notes (Signed)
Physical Therapy Treatment Patient Details Name: Nancy Joyce MRN: [id number] DOB: 1926-09-06 Today's Date: 03/29/2023   History of Present Illness Pt is a 89 y/o F admitted on 03/31/2023 after presenting with c/o unwitnessed fall in the shower, unknown how long pt was lying on the floor. CT showed subacute R PCA infarct. MRI showed acute infarcts in the medial R occipital lobe & L thalamus. Pt is being treated for syncope. PMH: HTN    PT Comments  Pt alert, oriented to self only. Denied pain initially but with weight bearing referenced having pain in bilateral legs. She was able to follow one step commands consistently, HOH noted. BUE and BLE noted for gross symmetric strength/coordination with functional assessment. She performed supine <> sit with verbal/visual cues and minA. Fair sitting balance for several minutes. Sit <> stand with RW and CGA (cued for hand placement to maximize safety. She ambulated no more than 5 feet with RW and CGA but reported increased pain and fatigue, no complaints of dizziness/light headedness. Returned to supine with needs in reach and placed in chair position. The patient would benefit from further skilled PT intervention to continue to progress towards goals.    If plan is discharge home, recommend the following: Two people to help with walking and/or transfers;Two people to help with bathing/dressing/bathroom;Direct supervision/assist for medications management;Help with stairs or ramp for entrance;Assist for transportation;Assistance with feeding;Assistance with cooking/housework;Direct supervision/assist for financial management;Supervision due to cognitive status   Can travel by private vehicle     No  Equipment Recommendations   (defer to next venue of care)    Recommendations for Other Services       Precautions / Restrictions Precautions Precautions: Fall Restrictions Weight Bearing Restrictions: No     Mobility  Bed Mobility Overal bed  mobility: Needs Assistance Bed Mobility: Supine to Sit, Sit to Supine     Supine to sit: Min assist Sit to supine: Mod assist   General bed mobility comments: verbal and visual cues for step by step sequencing    Transfers Overall transfer level: Needs assistance Equipment used: Rolling walker (2 wheels) Transfers: Sit to/from Stand Sit to Stand: Contact guard assist           General transfer comment: cued for hand placement to maximize safety    Ambulation/Gait Ambulation/Gait assistance: Contact guard assist Gait Distance (Feet): 5 Feet           General Gait Details: fatigued quickly   Stairs             Wheelchair Mobility     Tilt Bed    Modified Rankin (Stroke Patients Only)       Balance Overall balance assessment: Needs assistance Sitting-balance support: Feet supported Sitting balance-Leahy Scale: Fair       Standing balance-Leahy Scale: Poor                              Cognition Arousal: Alert Behavior During Therapy: WFL for tasks assessed/performed Overall Cognitive Status: No family/caregiver present to determine baseline cognitive functioning                                 General Comments: pt oriented to self only. answers questions appropriately, [facility]        Exercises      General Comments        Pertinent Vitals/Pain  Pain Assessment Faces Pain Scale: Hurts a little bit Pain Location: bilateral legs Pain Descriptors / Indicators: Sore Pain Intervention(s): Limited activity within patient's tolerance, Monitored during session, Repositioned    Home Living                          Prior Function            PT Goals (current goals can now be found in the care plan section) Progress towards PT goals: Progressing toward goals    Frequency    Min 1X/week      PT Plan      Co-evaluation              AM-PAC PT "6 Clicks" Mobility   Outcome Measure  Help  needed turning from your back to your side while in a flat bed without using bedrails?: A Lot Help needed moving from lying on your back to sitting on the side of a flat bed without using bedrails?: A Lot Help needed moving to and from a bed to a chair (including a wheelchair)?: A Lot Help needed standing up from a chair using your arms (e.g., wheelchair or bedside chair)?: A Little Help needed to walk in hospital room?: A Little Help needed climbing 3-5 steps with a railing? : Total 6 Click Score: 13    End of Session Equipment Utilized During Treatment: Gait belt Activity Tolerance: Patient tolerated treatment well;Patient limited by fatigue Patient left: in bed;with call bell/phone within reach;with bed alarm set Nurse Communication: Mobility status PT Visit Diagnosis: Muscle weakness (generalized) (M62.81);Difficulty in walking, not elsewhere classified (R26.2);Unsteadiness on feet (R26.81);Other abnormalities of gait and mobility (R26.89)     Time: 1610-9604 PT Time Calculation (min) (ACUTE ONLY): 17 min  Charges:    $Therapeutic Activity: 8-22 mins PT General Charges $$ ACUTE PT VISIT: 1 Visit                    Olga Coaster PT, DPT 3:05 PM,03/29/23

## 2023-03-29 NOTE — TOC CM/SW Note (Signed)
PT and OT recommend SNF. Patient is not oriented per chart review. Left VM for son Gery Pray requesting return call.  Alfonso Ramus, LCSW Transitions of Care Department [phone]

## 2023-03-29 NOTE — Plan of Care (Signed)
CHL Tonsillectomy/Adenoidectomy, Postoperative PEDS care plan entered in error.

## 2023-03-29 NOTE — Care Management Important Message (Signed)
Important Message  Patient Details  Name: Nancy Joyce MRN: [id number] Date of Birth: 09-09-1926   Important Message Given:  N/A - LOS <3 / Initial given by admissions     Olegario Messier [name] 03/29/2023, 7:40 AM

## 2023-03-29 NOTE — TOC Initial Note (Signed)
Transition of Care [facility]) - Initial/Assessment Note    Patient Details  Name: Nancy Joyce MRN: [id number] Date of Birth: 1927-01-13  [facility] CM/SW Contact:    Liliana Cline, LCSW Phone Number: 03/29/2023, 2:23 PM  Clinical Narrative:                 Call received from [name] (patient's daughter in law, Barry's wife). [name] states patient was living at home alone prior to this admission, however the plan is for patient to move into [facility]. CSW explained PT and OT recs for SNF. [name] stated they want to see how patient does with PT over the weekend before deciding about SNF. [name] states she understands that [facility] typically has to come assess patients before letting them come there from the hospital and if SNF is recommended they typically won't let patients return without STR at a SNF. [name] requests TOC follow up after PT/OT see patient again this weekend.  [name] also requested a mini mental exam - updated MD to see if we can do this.    Expected Discharge Plan: Skilled Nursing Facility Barriers to Discharge: Continued Medical Work up   Patient Goals and CMS Choice   CMS Medicare.gov Compare Post Acute Care list provided to:: Patient Represenative (must comment) Choice offered to / list presented to : Adult Children      Expected Discharge Plan and Services       Living arrangements for the past 2 months: Single Family Home                                      Prior Living Arrangements/Services Living arrangements for the past 2 months: Single Family Home Lives with:: Self Patient language and need for interpreter reviewed:: Yes Do you feel safe going back to the place where you live?: Yes      Need for Family Participation in Patient Care: Yes (Comment) Care giver support system in place?: Yes (comment)   Criminal Activity/Legal Involvement Pertinent to Current Situation/Hospitalization: No - Comment as  needed  Activities of Daily Living   ADL Screening (condition at time of admission) Independently performs ADLs?: No Does the patient have a NEW difficulty with bathing/dressing/toileting/self-feeding that is expected to last >3 days?: No Does the patient have a NEW difficulty with getting in/out of bed, walking, or climbing stairs that is expected to last >3 days?: No Does the patient have a NEW difficulty with communication that is expected to last >3 days?: Yes (Initiates electronic notice to provider for possible SLP consult) Is the patient deaf or have difficulty hearing?: Yes Does the patient have difficulty seeing, even when wearing glasses/contacts?: Yes Does the patient have difficulty concentrating, remembering, or making decisions?: Yes  Permission Sought/Granted Permission sought to share information with : Oceanographer granted to share information with : Yes, Verbal Permission Granted (by daughter in law [name])     Permission granted to share info w AGENCY: [employer], SNFs if needed        Emotional Assessment       Orientation: : Oriented to Self, Oriented to Place, Oriented to  Time, Oriented to Situation Alcohol / Substance Use: Not Applicable Psych Involvement: No (comment)  Admission diagnosis:  Rhabdomyolysis [M62.82] CVA (cerebral vascular accident) (HCC) [I63.9] Fall, initial encounter [W19.XXXA] Non-traumatic rhabdomyolysis [M62.82] Altered mental status, unspecified altered  mental status type [R41.82] Patient Active Problem List   Diagnosis Date Noted   CVA (cerebral vascular accident) (HCC) 03/28/2023   Rhabdomyolysis 04/08/2023   Syncope 04/09/2023   Elevated troponin 03/28/2023   Acute right PCA stroke (HCC) 04/03/2023   Sundowning 04/03/2023   Memory impairment 03/12/2023   PCP:  Rolm Gala, MD Pharmacy:   [facility] [place] [address] [place] [address] Phone: [phone] Fax:  [phone]     Social Determinants of Health (SDOH) Social History: SDOH Screenings   Food Insecurity: No Food Insecurity (03/28/2023)  Housing: Low Risk  (03/28/2023)  Transportation Needs: No Transportation Needs (03/28/2023)  Utilities: Not At Risk (03/28/2023)  Tobacco Use: Low Risk  (03/19/2023)   SDOH Interventions:     Readmission Risk Interventions     No data to display

## 2023-03-29 NOTE — Progress Notes (Signed)
PROGRESS NOTE    Nancy Joyce  WJX:[id number] DOB: 1926/06/20 DOA: 03/25/2023 PCP: Rolm Gala, MD   Assessment & Plan:   Principal Problem:   Syncope Active Problems:   Rhabdomyolysis   Elevated troponin   Acute right PCA stroke (HCC)   Sundowning   Memory impairment   CVA (cerebral vascular accident) (HCC)  Assessment and Plan:  Acute right PCA CVA: as per CT head & MRI brain. Continue on aspirin, plavix indefinitely  as per neuro. Holding statin. Continue w/ neuro checks. Echo shows EF 60-65%, grade I diastolic dysfunction & no atrial shunt is detected. Will need f/u outpatient w/ neuro. Neuro signed ouff   Syncope: likely secondary to CVA. Continue on tele   Memory impairment: has been worsening over the the last year as per pt's son. ?Dementia. Haldol prn    Elevated troponin: likely secondary to demand ischemia   Rhabdomyolysis: likely secondary to unwitnessed fall/syncope at home. CK is still elevated but trending down. Holding statin. Will continue to monitor       DVT prophylaxis: SCDs Code Status: DNR Family Communication: discussed pt's care w/ pt's son and answered his questions  Disposition Plan: PT/OT recs SNF   Level of care: Telemetry Medical Status is: Inpatient Remains inpatient appropriate because: severity of illness    Consultants:  Neuro   Procedures:   Antimicrobials:   Subjective: Pt c/o fatigue   Objective: Vitals:   03/28/23 1200 03/28/23 1940 03/28/23 2324 03/29/23 0423  BP: (!) 145/46 (!) 153/59 (!) 148/60 104/60  Pulse: 67 78 88 80  Resp: 17   20  Temp: 98.1 F (36.7 C) 97.6 F (36.4 C) 97.7 F (36.5 C) 98.7 F (37.1 C)  TempSrc:  Axillary Oral Axillary  SpO2: 100% 95% 94% 98%  Height:        Intake/Output Summary (Last 24 hours) at 03/29/2023 0804 Last data filed at 03/28/2023 1500 Gross per 24 hour  Intake --  Output 610 ml  Net -610 ml   There were no vitals filed for this  visit.  Examination:  General exam: Appears calm & comfortable  Respiratory system: diminished breath sounds b/l Cardiovascular system: S1/S2+. No rubs or clicks  Gastrointestinal system: abd is soft, NT, ND & hypoactive bowel sounds Central nervous system: alert & awake. Moves all extremities  Psychiatry: judgement and insight appears improved. Flat mood and affect    Data Reviewed: I have personally reviewed following labs and imaging studies  CBC: Recent Labs  Lab 03/25/2023 1459 03/28/23 0227  WBC 10.5 12.3*  NEUTROABS 9.1*  --   HGB 13.7 13.7  HCT 40.9 40.5  MCV 88.3 88.2  PLT 257 236   Basic Metabolic Panel: Recent Labs  Lab 04/07/2023 1459 03/28/23 0227  NA 132* 136  K 4.2 4.1  CL 98 103  CO2 25 23  GLUCOSE 133* 145*  BUN 36* 32*  CREATININE 0.82 0.81  CALCIUM 9.0 8.9   GFR: CrCl cannot be calculated (Unknown ideal weight.). Liver Function Tests: Recent Labs  Lab 03/24/2023 1459  AST 112*  ALT 56*  ALKPHOS 92  BILITOT 1.3*  PROT 7.3  ALBUMIN 3.6   No results for input(s): "LIPASE", "AMYLASE" in the last 168 hours. No results for input(s): "AMMONIA" in the last 168 hours. Coagulation Profile: Recent Labs  Lab 03/15/2023 1459  INR 1.1   Cardiac Enzymes: Recent Labs  Lab 03/30/2023 1459 03/28/23 0227  CKTOTAL 2,445* 2,571*   BNP (last 3 results) No results  PROGRESS NOTE    Nancy Joyce  WJX:[id number] DOB: 1926/06/20 DOA: 03/25/2023 PCP: Rolm Gala, MD   Assessment & Plan:   Principal Problem:   Syncope Active Problems:   Rhabdomyolysis   Elevated troponin   Acute right PCA stroke (HCC)   Sundowning   Memory impairment   CVA (cerebral vascular accident) (HCC)  Assessment and Plan:  Acute right PCA CVA: as per CT head & MRI brain. Continue on aspirin, plavix indefinitely  as per neuro. Holding statin. Continue w/ neuro checks. Echo shows EF 60-65%, grade I diastolic dysfunction & no atrial shunt is detected. Will need f/u outpatient w/ neuro. Neuro signed ouff   Syncope: likely secondary to CVA. Continue on tele   Memory impairment: has been worsening over the the last year as per pt's son. ?Dementia. Haldol prn    Elevated troponin: likely secondary to demand ischemia   Rhabdomyolysis: likely secondary to unwitnessed fall/syncope at home. CK is still elevated but trending down. Holding statin. Will continue to monitor       DVT prophylaxis: SCDs Code Status: DNR Family Communication: discussed pt's care w/ pt's son and answered his questions  Disposition Plan: PT/OT recs SNF   Level of care: Telemetry Medical Status is: Inpatient Remains inpatient appropriate because: severity of illness    Consultants:  Neuro   Procedures:   Antimicrobials:   Subjective: Pt c/o fatigue   Objective: Vitals:   03/28/23 1200 03/28/23 1940 03/28/23 2324 03/29/23 0423  BP: (!) 145/46 (!) 153/59 (!) 148/60 104/60  Pulse: 67 78 88 80  Resp: 17   20  Temp: 98.1 F (36.7 C) 97.6 F (36.4 C) 97.7 F (36.5 C) 98.7 F (37.1 C)  TempSrc:  Axillary Oral Axillary  SpO2: 100% 95% 94% 98%  Height:        Intake/Output Summary (Last 24 hours) at 03/29/2023 0804 Last data filed at 03/28/2023 1500 Gross per 24 hour  Intake --  Output 610 ml  Net -610 ml   There were no vitals filed for this  visit.  Examination:  General exam: Appears calm & comfortable  Respiratory system: diminished breath sounds b/l Cardiovascular system: S1/S2+. No rubs or clicks  Gastrointestinal system: abd is soft, NT, ND & hypoactive bowel sounds Central nervous system: alert & awake. Moves all extremities  Psychiatry: judgement and insight appears improved. Flat mood and affect    Data Reviewed: I have personally reviewed following labs and imaging studies  CBC: Recent Labs  Lab 03/25/2023 1459 03/28/23 0227  WBC 10.5 12.3*  NEUTROABS 9.1*  --   HGB 13.7 13.7  HCT 40.9 40.5  MCV 88.3 88.2  PLT 257 236   Basic Metabolic Panel: Recent Labs  Lab 04/07/2023 1459 03/28/23 0227  NA 132* 136  K 4.2 4.1  CL 98 103  CO2 25 23  GLUCOSE 133* 145*  BUN 36* 32*  CREATININE 0.82 0.81  CALCIUM 9.0 8.9   GFR: CrCl cannot be calculated (Unknown ideal weight.). Liver Function Tests: Recent Labs  Lab 03/24/2023 1459  AST 112*  ALT 56*  ALKPHOS 92  BILITOT 1.3*  PROT 7.3  ALBUMIN 3.6   No results for input(s): "LIPASE", "AMYLASE" in the last 168 hours. No results for input(s): "AMMONIA" in the last 168 hours. Coagulation Profile: Recent Labs  Lab 03/15/2023 1459  INR 1.1   Cardiac Enzymes: Recent Labs  Lab 03/30/2023 1459 03/28/23 0227  CKTOTAL 2,445* 2,571*   BNP (last 3 results) No results  than 50% respiratory variability, suggesting right atrial pressure of 3 mmHg. IAS/Shunts: No atrial level shunt detected by color flow Doppler.  LEFT VENTRICLE PLAX 2D LVIDd:         3.50 cm Diastology LVIDs:         2.00 cm LV e' medial:    5.66 cm/s LV PW:         1.10 cm LV E/e' medial:  13.2 LV IVS:        1.30 cm LV e' lateral:   7.18 cm/s                        LV E/e' lateral: 10.4  RIGHT VENTRICLE             IVC RV Basal diam:  3.10 cm     IVC diam: 1.40 cm RV S prime:     17.45 cm/s TAPSE (M-mode): 1.9 cm LEFT ATRIUM             Index        RIGHT ATRIUM           Index LA diam:        3.70 cm 2.28 cm/m   RA Area:     14.10 cm LA Vol (A2C):   31.4 ml 19.34 ml/m  RA Volume:   36.20 ml  22.29 ml/m LA Vol (A4C):   25.0 ml 15.40 ml/m LA Biplane Vol: 29.3 ml 18.05 ml/m  AORTIC VALVE LVOT Vmax:   121.50 cm/s LVOT Vmean:  78.850 cm/s LVOT VTI:    0.226 m  AORTA Ao Root diam: 3.20 cm Ao Asc diam:  3.30 cm MITRAL VALVE MV Area (PHT): 4.80 cm     SHUNTS MV Decel Time: 158 msec     Systemic VTI: 0.23 m MV E velocity: 74.70 cm/s MV A velocity: 139.00 cm/s MV E/A ratio:  0.54 Julien Nordmann MD Electronically signed by Julien Nordmann MD Signature Date/Time: 03/28/2023/12:32:17 PM    Final    MR BRAIN WO CONTRAST  Result Date: 03/28/2023 CLINICAL DATA:  Unwitnessed fall EXAM:  MRI HEAD WITHOUT CONTRAST TECHNIQUE: Multiplanar, multiecho pulse sequences of the brain and surrounding structures were obtained without intravenous contrast. COMPARISON:  03/18/2015 MRI head, correlation is also made with 03/15/2023 CT head FINDINGS: Brain: Restricted diffusion with ADC correlate in the medial right occipital lobe (series 5, image 63), which is associated with increased T2 hyperintense signal. Additional punctate focus of diffusion restriction in the left thalamus (series 5, image 69), which is also associated increased T2 hyperintense signal. These are concerning for acute infarcts. No acute hemorrhage, mass, mass effect, or midline shift. No hydrocephalus or extra-axial collection. Normal pituitary and craniocervical junction. No hemosiderin deposition to suggest remote hemorrhage. Confluent T2 hyperintense signal in the periventricular white matter, likely the sequela of moderate to severe chronic small vessel ischemic disease. Vascular: Normal arterial flow voids. Skull and upper cervical spine: Normal marrow signal. Sinuses/Orbits: Clear paranasal sinuses. No acute finding in the orbits. Other: The mastoid air cells are well aerated. IMPRESSION: Acute infarcts in the medial right occipital lobe and left thalamus. These results will be called to the ordering clinician or representative by the Radiologist Assistant, and communication documented in the PACS or [employer]. Electronically Signed   By: Wiliam Ke M.D.   On: 03/28/2023 02:27   DG Chest Port 1 View  Result Date: 04/04/2023 CLINICAL DATA:  Unwitnessed fall in shower. Family found patient on  PROGRESS NOTE    Nancy Joyce  WJX:[id number] DOB: 1926/06/20 DOA: 03/25/2023 PCP: Rolm Gala, MD   Assessment & Plan:   Principal Problem:   Syncope Active Problems:   Rhabdomyolysis   Elevated troponin   Acute right PCA stroke (HCC)   Sundowning   Memory impairment   CVA (cerebral vascular accident) (HCC)  Assessment and Plan:  Acute right PCA CVA: as per CT head & MRI brain. Continue on aspirin, plavix indefinitely  as per neuro. Holding statin. Continue w/ neuro checks. Echo shows EF 60-65%, grade I diastolic dysfunction & no atrial shunt is detected. Will need f/u outpatient w/ neuro. Neuro signed ouff   Syncope: likely secondary to CVA. Continue on tele   Memory impairment: has been worsening over the the last year as per pt's son. ?Dementia. Haldol prn    Elevated troponin: likely secondary to demand ischemia   Rhabdomyolysis: likely secondary to unwitnessed fall/syncope at home. CK is still elevated but trending down. Holding statin. Will continue to monitor       DVT prophylaxis: SCDs Code Status: DNR Family Communication: discussed pt's care w/ pt's son and answered his questions  Disposition Plan: PT/OT recs SNF   Level of care: Telemetry Medical Status is: Inpatient Remains inpatient appropriate because: severity of illness    Consultants:  Neuro   Procedures:   Antimicrobials:   Subjective: Pt c/o fatigue   Objective: Vitals:   03/28/23 1200 03/28/23 1940 03/28/23 2324 03/29/23 0423  BP: (!) 145/46 (!) 153/59 (!) 148/60 104/60  Pulse: 67 78 88 80  Resp: 17   20  Temp: 98.1 F (36.7 C) 97.6 F (36.4 C) 97.7 F (36.5 C) 98.7 F (37.1 C)  TempSrc:  Axillary Oral Axillary  SpO2: 100% 95% 94% 98%  Height:        Intake/Output Summary (Last 24 hours) at 03/29/2023 0804 Last data filed at 03/28/2023 1500 Gross per 24 hour  Intake --  Output 610 ml  Net -610 ml   There were no vitals filed for this  visit.  Examination:  General exam: Appears calm & comfortable  Respiratory system: diminished breath sounds b/l Cardiovascular system: S1/S2+. No rubs or clicks  Gastrointestinal system: abd is soft, NT, ND & hypoactive bowel sounds Central nervous system: alert & awake. Moves all extremities  Psychiatry: judgement and insight appears improved. Flat mood and affect    Data Reviewed: I have personally reviewed following labs and imaging studies  CBC: Recent Labs  Lab 03/25/2023 1459 03/28/23 0227  WBC 10.5 12.3*  NEUTROABS 9.1*  --   HGB 13.7 13.7  HCT 40.9 40.5  MCV 88.3 88.2  PLT 257 236   Basic Metabolic Panel: Recent Labs  Lab 04/07/2023 1459 03/28/23 0227  NA 132* 136  K 4.2 4.1  CL 98 103  CO2 25 23  GLUCOSE 133* 145*  BUN 36* 32*  CREATININE 0.82 0.81  CALCIUM 9.0 8.9   GFR: CrCl cannot be calculated (Unknown ideal weight.). Liver Function Tests: Recent Labs  Lab 03/24/2023 1459  AST 112*  ALT 56*  ALKPHOS 92  BILITOT 1.3*  PROT 7.3  ALBUMIN 3.6   No results for input(s): "LIPASE", "AMYLASE" in the last 168 hours. No results for input(s): "AMMONIA" in the last 168 hours. Coagulation Profile: Recent Labs  Lab 03/15/2023 1459  INR 1.1   Cardiac Enzymes: Recent Labs  Lab 03/30/2023 1459 03/28/23 0227  CKTOTAL 2,445* 2,571*   BNP (last 3 results) No results  than 50% respiratory variability, suggesting right atrial pressure of 3 mmHg. IAS/Shunts: No atrial level shunt detected by color flow Doppler.  LEFT VENTRICLE PLAX 2D LVIDd:         3.50 cm Diastology LVIDs:         2.00 cm LV e' medial:    5.66 cm/s LV PW:         1.10 cm LV E/e' medial:  13.2 LV IVS:        1.30 cm LV e' lateral:   7.18 cm/s                        LV E/e' lateral: 10.4  RIGHT VENTRICLE             IVC RV Basal diam:  3.10 cm     IVC diam: 1.40 cm RV S prime:     17.45 cm/s TAPSE (M-mode): 1.9 cm LEFT ATRIUM             Index        RIGHT ATRIUM           Index LA diam:        3.70 cm 2.28 cm/m   RA Area:     14.10 cm LA Vol (A2C):   31.4 ml 19.34 ml/m  RA Volume:   36.20 ml  22.29 ml/m LA Vol (A4C):   25.0 ml 15.40 ml/m LA Biplane Vol: 29.3 ml 18.05 ml/m  AORTIC VALVE LVOT Vmax:   121.50 cm/s LVOT Vmean:  78.850 cm/s LVOT VTI:    0.226 m  AORTA Ao Root diam: 3.20 cm Ao Asc diam:  3.30 cm MITRAL VALVE MV Area (PHT): 4.80 cm     SHUNTS MV Decel Time: 158 msec     Systemic VTI: 0.23 m MV E velocity: 74.70 cm/s MV A velocity: 139.00 cm/s MV E/A ratio:  0.54 Julien Nordmann MD Electronically signed by Julien Nordmann MD Signature Date/Time: 03/28/2023/12:32:17 PM    Final    MR BRAIN WO CONTRAST  Result Date: 03/28/2023 CLINICAL DATA:  Unwitnessed fall EXAM:  MRI HEAD WITHOUT CONTRAST TECHNIQUE: Multiplanar, multiecho pulse sequences of the brain and surrounding structures were obtained without intravenous contrast. COMPARISON:  03/18/2015 MRI head, correlation is also made with 03/15/2023 CT head FINDINGS: Brain: Restricted diffusion with ADC correlate in the medial right occipital lobe (series 5, image 63), which is associated with increased T2 hyperintense signal. Additional punctate focus of diffusion restriction in the left thalamus (series 5, image 69), which is also associated increased T2 hyperintense signal. These are concerning for acute infarcts. No acute hemorrhage, mass, mass effect, or midline shift. No hydrocephalus or extra-axial collection. Normal pituitary and craniocervical junction. No hemosiderin deposition to suggest remote hemorrhage. Confluent T2 hyperintense signal in the periventricular white matter, likely the sequela of moderate to severe chronic small vessel ischemic disease. Vascular: Normal arterial flow voids. Skull and upper cervical spine: Normal marrow signal. Sinuses/Orbits: Clear paranasal sinuses. No acute finding in the orbits. Other: The mastoid air cells are well aerated. IMPRESSION: Acute infarcts in the medial right occipital lobe and left thalamus. These results will be called to the ordering clinician or representative by the Radiologist Assistant, and communication documented in the PACS or [employer]. Electronically Signed   By: Wiliam Ke M.D.   On: 03/28/2023 02:27   DG Chest Port 1 View  Result Date: 04/04/2023 CLINICAL DATA:  Unwitnessed fall in shower. Family found patient on  than 50% respiratory variability, suggesting right atrial pressure of 3 mmHg. IAS/Shunts: No atrial level shunt detected by color flow Doppler.  LEFT VENTRICLE PLAX 2D LVIDd:         3.50 cm Diastology LVIDs:         2.00 cm LV e' medial:    5.66 cm/s LV PW:         1.10 cm LV E/e' medial:  13.2 LV IVS:        1.30 cm LV e' lateral:   7.18 cm/s                        LV E/e' lateral: 10.4  RIGHT VENTRICLE             IVC RV Basal diam:  3.10 cm     IVC diam: 1.40 cm RV S prime:     17.45 cm/s TAPSE (M-mode): 1.9 cm LEFT ATRIUM             Index        RIGHT ATRIUM           Index LA diam:        3.70 cm 2.28 cm/m   RA Area:     14.10 cm LA Vol (A2C):   31.4 ml 19.34 ml/m  RA Volume:   36.20 ml  22.29 ml/m LA Vol (A4C):   25.0 ml 15.40 ml/m LA Biplane Vol: 29.3 ml 18.05 ml/m  AORTIC VALVE LVOT Vmax:   121.50 cm/s LVOT Vmean:  78.850 cm/s LVOT VTI:    0.226 m  AORTA Ao Root diam: 3.20 cm Ao Asc diam:  3.30 cm MITRAL VALVE MV Area (PHT): 4.80 cm     SHUNTS MV Decel Time: 158 msec     Systemic VTI: 0.23 m MV E velocity: 74.70 cm/s MV A velocity: 139.00 cm/s MV E/A ratio:  0.54 Julien Nordmann MD Electronically signed by Julien Nordmann MD Signature Date/Time: 03/28/2023/12:32:17 PM    Final    MR BRAIN WO CONTRAST  Result Date: 03/28/2023 CLINICAL DATA:  Unwitnessed fall EXAM:  MRI HEAD WITHOUT CONTRAST TECHNIQUE: Multiplanar, multiecho pulse sequences of the brain and surrounding structures were obtained without intravenous contrast. COMPARISON:  03/18/2015 MRI head, correlation is also made with 03/15/2023 CT head FINDINGS: Brain: Restricted diffusion with ADC correlate in the medial right occipital lobe (series 5, image 63), which is associated with increased T2 hyperintense signal. Additional punctate focus of diffusion restriction in the left thalamus (series 5, image 69), which is also associated increased T2 hyperintense signal. These are concerning for acute infarcts. No acute hemorrhage, mass, mass effect, or midline shift. No hydrocephalus or extra-axial collection. Normal pituitary and craniocervical junction. No hemosiderin deposition to suggest remote hemorrhage. Confluent T2 hyperintense signal in the periventricular white matter, likely the sequela of moderate to severe chronic small vessel ischemic disease. Vascular: Normal arterial flow voids. Skull and upper cervical spine: Normal marrow signal. Sinuses/Orbits: Clear paranasal sinuses. No acute finding in the orbits. Other: The mastoid air cells are well aerated. IMPRESSION: Acute infarcts in the medial right occipital lobe and left thalamus. These results will be called to the ordering clinician or representative by the Radiologist Assistant, and communication documented in the PACS or [employer]. Electronically Signed   By: Wiliam Ke M.D.   On: 03/28/2023 02:27   DG Chest Port 1 View  Result Date: 04/04/2023 CLINICAL DATA:  Unwitnessed fall in shower. Family found patient on

## 2023-03-30 DIAGNOSIS — I6389 Other cerebral infarction: Secondary | ICD-10-CM | POA: Diagnosis not present

## 2023-03-30 LAB — CBC
HCT: 42 % (ref 36.0–46.0)
Hemoglobin: 14.4 g/dL (ref 12.0–15.0)
MCH: 30.3 pg (ref 26.0–34.0)
MCHC: 34.3 g/dL (ref 30.0–36.0)
MCV: 88.2 fL (ref 80.0–100.0)
Platelets: 240 10*3/uL (ref 150–400)
RBC: 4.76 MIL/uL (ref 3.87–5.11)
RDW: 14.6 % (ref 11.5–15.5)
WBC: 10.8 10*3/uL — ABNORMAL HIGH (ref 4.0–10.5)
nRBC: 0 % (ref 0.0–0.2)

## 2023-03-30 LAB — BASIC METABOLIC PANEL
Anion gap: 11 (ref 5–15)
BUN: 40 mg/dL — ABNORMAL HIGH (ref 8–23)
CO2: 23 mmol/L (ref 22–32)
Calcium: 9 mg/dL (ref 8.9–10.3)
Chloride: 109 mmol/L (ref 98–111)
Creatinine, Ser: 0.74 mg/dL (ref 0.44–1.00)
GFR, Estimated: >60 mL/min (ref >60)
Glucose, Bld: 127 mg/dL — ABNORMAL HIGH (ref 70–99)
Potassium: 3.5 mmol/L (ref 3.5–5.1)
Sodium: 143 mmol/L (ref 135–145)

## 2023-03-30 LAB — CK: Total CK: 1580 U/L — ABNORMAL HIGH (ref 38–234)

## 2023-03-30 NOTE — Progress Notes (Signed)
PROGRESS NOTE    Nancy Joyce  ZOX:[id number] DOB: 1926/10/27 DOA: 04/23/23 PCP: Rolm Gala, MD   Assessment & Plan:   Principal Problem:   Syncope Active Problems:   Rhabdomyolysis   Elevated troponin   Acute right PCA stroke (HCC)   Sundowning   Memory impairment   CVA (cerebral vascular accident) (HCC)  Assessment and Plan:  Acute right PCA CVA: as per CT head & MRI brain. Continue on plavix, aspirin indefinitely as per neuro. Holding statin. Continue w/ neuro checks. Echo shows EF 60-65%, grade I diastolic dysfunction & no atrial shunt is detected. Will need f/u outpatient w/ neuro. Neuro signed ouff   Syncope: likely secondary to CVA. Continue on tele   Memory impairment: has been worsening over the the last year as per pt's son. ?Dementia. Haldol prn     Elevated troponin: likely secondary to demand ischemia    Rhabdomyolysis: likely secondary to unwitnessed fall/syncope at home. CK is labile.  Holding statin. Will continue to monitor       DVT prophylaxis: SCDs Code Status: DNR Family Communication:  Disposition Plan: PT/OT recs SNF   Level of care: Telemetry Medical Status is: Inpatient Remains inpatient appropriate because: severity of illness    Consultants:  Neuro   Procedures:   Antimicrobials:   Subjective: Pt c/o malaise   Objective: Vitals:   03/29/23 1355 03/29/23 1627 03/29/23 2034 03/30/23 0404  BP:  (!) 160/52 (!) 169/70 130/63  Pulse:  79 78 78  Resp: (!) 21 18  18   Temp:  98.5 F (36.9 C)  98.9 F (37.2 C)  TempSrc:  Oral  Oral  SpO2:  96%  97%  Height:        Intake/Output Summary (Last 24 hours) at 03/30/2023 0758 Last data filed at 03/29/2023 1329 Gross per 24 hour  Intake --  Output 700 ml  Net -700 ml   There were no vitals filed for this visit.  Examination:  General exam: appears comfortable  Respiratory system: decreased breath sounds b/l  Cardiovascular system: S1/S2+. No rubs or clicks   Gastrointestinal system: abd is soft, NT, ND & hypoactive bowel sounds Central nervous system: alert & awake. Moves all extremities Psychiatry: judgement and insight appears improved. Flat mood and affect    Data Reviewed: I have personally reviewed following labs and imaging studies  CBC: Recent Labs  Lab 04/23/2023 1459 03/28/23 0227 03/29/23 0738 03/30/23 0623  WBC 10.5 12.3* 11.2* 10.8*  NEUTROABS 9.1*  --   --   --   HGB 13.7 13.7 12.6 14.4  HCT 40.9 40.5 37.9 42.0  MCV 88.3 88.2 89.2 88.2  PLT 257 236 228 240   Basic Metabolic Panel: Recent Labs  Lab 2023-04-23 1459 03/28/23 0227 03/29/23 0738 03/30/23 0623  NA 132* 136 140 143  K 4.2 4.1 3.4* 3.5  CL 98 103 107 109  CO2 25 23 21* 23  GLUCOSE 133* 145* 98 127*  BUN 36* 32* 42* 40*  CREATININE 0.82 0.81 0.78 0.74  CALCIUM 9.0 8.9 8.8* 9.0   GFR: CrCl cannot be calculated (Unknown ideal weight.). Liver Function Tests: Recent Labs  Lab April 23, 2023 1459  AST 112*  ALT 56*  ALKPHOS 92  BILITOT 1.3*  PROT 7.3  ALBUMIN 3.6   No results for input(s): "LIPASE", "AMYLASE" in the last 168 hours. No results for input(s): "AMMONIA" in the last 168 hours. Coagulation Profile: Recent Labs  Lab 04/23/2023 1459  INR 1.1   Cardiac Enzymes:  Recent Labs  Lab 04/04/2023 1459 03/28/23 0227 03/29/23 0737 03/30/23 0623  CKTOTAL 2,445* 2,571* 1,462* 1,580*   BNP (last 3 results) No results for input(s): "PROBNP" in the last 8760 hours. HbA1C: Recent Labs    03/28/23 0227  HGBA1C 5.7*   CBG: Recent Labs  Lab 03/31/2023 1459  GLUCAP 120*   Lipid Profile: Recent Labs    03/28/23 0227  CHOL 186  HDL 106  LDLCALC 66  TRIG 72  CHOLHDL 1.8   Thyroid Function Tests: No results for input(s): "TSH", "T4TOTAL", "FREET4", "T3FREE", "THYROIDAB" in the last 72 hours. Anemia Panel: No results for input(s): "VITAMINB12", "FOLATE", "FERRITIN", "TIBC", "IRON", "RETICCTPCT" in the last 72 hours. Sepsis Labs: No results  for input(s): "PROCALCITON", "LATICACIDVEN" in the last 168 hours.  No results found for this or any previous visit (from the past 240 hour(s)).       Radiology Studies: CT ANGIO HEAD NECK W WO CM  Result Date: 03/28/2023 CLINICAL DATA:  Stroke, follow up EXAM: CT ANGIOGRAPHY HEAD AND NECK WITH AND WITHOUT CONTRAST TECHNIQUE: Multidetector CT imaging of the head and neck was performed using the standard protocol during bolus administration of intravenous contrast. Multiplanar CT image reconstructions and MIPs were obtained to evaluate the vascular anatomy. Carotid stenosis measurements (when applicable) are obtained utilizing NASCET criteria, using the distal internal carotid diameter as the denominator. RADIATION DOSE REDUCTION: This exam was performed according to the departmental dose-optimization program which includes automated exposure control, adjustment of the mA and/or kV according to patient size and/or use of iterative reconstruction technique. CONTRAST:  75mL OMNIPAQUE IOHEXOL 350 MG/ML SOLN COMPARISON:  Brain MR 04/09/2023, CT head 03/16/2023 FINDINGS: CT HEAD FINDINGS Brain: Evolving right occipital lobe and left thalamic infarcts. No hemorrhage. No hydrocephalus. No extra-axial fluid collection. No mass effect. No mass lesion. No CT evidence of a new cortical infarct. Vascular: No hyperdense vessel or unexpected calcification. Skull: Normal. Negative for fracture or focal lesion. Sinuses/Orbits: No middle ear or mastoid effusion. Mild mucosal thickening bilateral maxillary sinuses. Orbits are unremarkable. Other: None. Review of the MIP images confirms the above findings CTA NECK FINDINGS Aortic arch: Standard branching. Imaged portion shows no evidence of aneurysm or dissection. No significant stenosis of the major arch vessel origins. Right carotid system: No evidence of dissection, stenosis (50% or greater), or occlusion. Mild (less than 50% narrowing of the origin of the right ICA Left  carotid system: No evidence of dissection, stenosis (50% or greater), or occlusion. Mild (less than 50%) narrowing of the origin of the left ICA. Vertebral arteries: Codominant. No evidence of dissection, stenosis (50% or greater), or occlusion. Skeleton: Negative. Other neck: 1.4 cm right thyroid nodule. Upper chest: Biapical pleural-parenchymal scarring Review of the MIP images confirms the above findings CTA HEAD FINDINGS Anterior circulation: Severe stenosis to near occlusion of the bilateral proximal M2 segments (series 11, image 93/series 11, image 90). Mild narrowing in the A2 segment of the left ACA. Posterior circulation: The right PCA is occluded at the origin. Moderate to severe nearly occlusive stenosis of the P3/P4 junction of the left PCA. Venous sinuses: As permitted by contrast timing, patent. Anatomic variants: None Review of the MIP images confirms the above findings IMPRESSION: 1. Evolving right occipital lobe and left thalamic infarcts. No hemorrhage. 2. The right PCA is occluded at the origin, in keeping with patients right PCA territory infarct. 3. Moderate to severe nearly occlusive stenosis at the P3/P4 junction of the left PCA. If the patient has  new stroke like symptoms that could be localized to either MCA territory a repeat MRI is recommended. 4. Severe stenosis to near occlusion of the bilateral proximal M2 segments. 5. No hemodynamically significant stenosis in the neck. Electronically Signed   By: Lorenza Cambridge M.D.   On: 03/28/2023 15:10        Scheduled Meds:   stroke: early stages of recovery book   Does not apply Once   aspirin EC  81 mg Oral Daily   clopidogrel  75 mg Oral Daily   Continuous Infusions:     LOS: 3 days        Charise Killian, MD [facility] Pager [phone]  If 7PM-7AM, please contact night-coverage [email or web address] 03/30/2023, 7:58 AM

## 2023-03-30 NOTE — Plan of Care (Signed)
  Problem: Ischemic Stroke/TIA Tissue Perfusion: Goal: Complications of ischemic stroke/TIA will be minimized Outcome: Not Progressing  Pt with Altered Mental Status

## 2023-03-30 NOTE — Progress Notes (Deleted)
Profile: Recent Labs  Lab 03/23/2023 1459  INR 1.1   Cardiac Enzymes: Recent Labs  Lab 03/26/2023 1459 03/28/23 0227 03/29/23 0737 03/30/23 0623  CKTOTAL 2,445* 2,571* 1,462* 1,580*   BNP (last 3 results) No results for input(s): "PROBNP" in the last 8760 hours. HbA1C: Recent Labs    03/28/23 0227  HGBA1C 5.7*   CBG: Recent Labs  Lab 04/05/2023 1459  GLUCAP 120*   Lipid Profile: Recent Labs    03/28/23 0227  CHOL 186  HDL 106  LDLCALC 66  TRIG 72  CHOLHDL 1.8   Thyroid Function Tests: No results for input(s): "TSH", "T4TOTAL", "FREET4", "T3FREE", "THYROIDAB" in the last 72 hours. Anemia Panel: No results for input(s): "VITAMINB12", "FOLATE",  "FERRITIN", "TIBC", "IRON", "RETICCTPCT" in the last 72 hours. Sepsis Labs: No results for input(s): "PROCALCITON", "LATICACIDVEN" in the last 168 hours.  No results found for this or any previous visit (from the past 240 hour(s)).       Radiology Studies: CT ANGIO HEAD NECK W WO CM  Result Date: 03/28/2023 CLINICAL DATA:  Stroke, follow up EXAM: CT ANGIOGRAPHY HEAD AND NECK WITH AND WITHOUT CONTRAST TECHNIQUE: Multidetector CT imaging of the head and neck was performed using the standard protocol during bolus administration of intravenous contrast. Multiplanar CT image reconstructions and MIPs were obtained to evaluate the vascular anatomy. Carotid stenosis measurements (when applicable) are obtained utilizing NASCET criteria, using the distal internal carotid diameter as the denominator. RADIATION DOSE REDUCTION: This exam was performed according to the departmental dose-optimization program which includes automated exposure control, adjustment of the mA and/or kV according to patient size and/or use of iterative reconstruction technique. CONTRAST:  75mL OMNIPAQUE IOHEXOL 350 MG/ML SOLN COMPARISON:  Brain MR 03/16/2023, CT head 03/24/2023 FINDINGS: CT HEAD FINDINGS Brain: Evolving right occipital lobe and left thalamic infarcts. No hemorrhage. No hydrocephalus. No extra-axial fluid collection. No mass effect. No mass lesion. No CT evidence of a new cortical infarct. Vascular: No hyperdense vessel or unexpected calcification. Skull: Normal. Negative for fracture or focal lesion. Sinuses/Orbits: No middle ear or mastoid effusion. Mild mucosal thickening bilateral maxillary sinuses. Orbits are unremarkable. Other: None. Review of the MIP images confirms the above findings CTA NECK FINDINGS Aortic arch: Standard branching. Imaged portion shows no evidence of aneurysm or dissection. No significant stenosis of the major arch vessel origins. Right carotid system: No evidence of dissection, stenosis (50% or  greater), or occlusion. Mild (less than 50% narrowing of the origin of the right ICA Left carotid system: No evidence of dissection, stenosis (50% or greater), or occlusion. Mild (less than 50%) narrowing of the origin of the left ICA. Vertebral arteries: Codominant. No evidence of dissection, stenosis (50% or greater), or occlusion. Skeleton: Negative. Other neck: 1.4 cm right thyroid nodule. Upper chest: Biapical pleural-parenchymal scarring Review of the MIP images confirms the above findings CTA HEAD FINDINGS Anterior circulation: Severe stenosis to near occlusion of the bilateral proximal M2 segments (series 11, image 93/series 11, image 90). Mild narrowing in the A2 segment of the left ACA. Posterior circulation: The right PCA is occluded at the origin. Moderate to severe nearly occlusive stenosis of the P3/P4 junction of the left PCA. Venous sinuses: As permitted by contrast timing, patent. Anatomic variants: None Review of the MIP images confirms the above findings IMPRESSION: 1. Evolving right occipital lobe and left thalamic infarcts. No hemorrhage. 2. The right PCA is occluded at the origin, in keeping with patients right PCA territory infarct. 3. Moderate to severe nearly  Profile: Recent Labs  Lab 03/23/2023 1459  INR 1.1   Cardiac Enzymes: Recent Labs  Lab 03/26/2023 1459 03/28/23 0227 03/29/23 0737 03/30/23 0623  CKTOTAL 2,445* 2,571* 1,462* 1,580*   BNP (last 3 results) No results for input(s): "PROBNP" in the last 8760 hours. HbA1C: Recent Labs    03/28/23 0227  HGBA1C 5.7*   CBG: Recent Labs  Lab 04/05/2023 1459  GLUCAP 120*   Lipid Profile: Recent Labs    03/28/23 0227  CHOL 186  HDL 106  LDLCALC 66  TRIG 72  CHOLHDL 1.8   Thyroid Function Tests: No results for input(s): "TSH", "T4TOTAL", "FREET4", "T3FREE", "THYROIDAB" in the last 72 hours. Anemia Panel: No results for input(s): "VITAMINB12", "FOLATE",  "FERRITIN", "TIBC", "IRON", "RETICCTPCT" in the last 72 hours. Sepsis Labs: No results for input(s): "PROCALCITON", "LATICACIDVEN" in the last 168 hours.  No results found for this or any previous visit (from the past 240 hour(s)).       Radiology Studies: CT ANGIO HEAD NECK W WO CM  Result Date: 03/28/2023 CLINICAL DATA:  Stroke, follow up EXAM: CT ANGIOGRAPHY HEAD AND NECK WITH AND WITHOUT CONTRAST TECHNIQUE: Multidetector CT imaging of the head and neck was performed using the standard protocol during bolus administration of intravenous contrast. Multiplanar CT image reconstructions and MIPs were obtained to evaluate the vascular anatomy. Carotid stenosis measurements (when applicable) are obtained utilizing NASCET criteria, using the distal internal carotid diameter as the denominator. RADIATION DOSE REDUCTION: This exam was performed according to the departmental dose-optimization program which includes automated exposure control, adjustment of the mA and/or kV according to patient size and/or use of iterative reconstruction technique. CONTRAST:  75mL OMNIPAQUE IOHEXOL 350 MG/ML SOLN COMPARISON:  Brain MR 03/16/2023, CT head 03/24/2023 FINDINGS: CT HEAD FINDINGS Brain: Evolving right occipital lobe and left thalamic infarcts. No hemorrhage. No hydrocephalus. No extra-axial fluid collection. No mass effect. No mass lesion. No CT evidence of a new cortical infarct. Vascular: No hyperdense vessel or unexpected calcification. Skull: Normal. Negative for fracture or focal lesion. Sinuses/Orbits: No middle ear or mastoid effusion. Mild mucosal thickening bilateral maxillary sinuses. Orbits are unremarkable. Other: None. Review of the MIP images confirms the above findings CTA NECK FINDINGS Aortic arch: Standard branching. Imaged portion shows no evidence of aneurysm or dissection. No significant stenosis of the major arch vessel origins. Right carotid system: No evidence of dissection, stenosis (50% or  greater), or occlusion. Mild (less than 50% narrowing of the origin of the right ICA Left carotid system: No evidence of dissection, stenosis (50% or greater), or occlusion. Mild (less than 50%) narrowing of the origin of the left ICA. Vertebral arteries: Codominant. No evidence of dissection, stenosis (50% or greater), or occlusion. Skeleton: Negative. Other neck: 1.4 cm right thyroid nodule. Upper chest: Biapical pleural-parenchymal scarring Review of the MIP images confirms the above findings CTA HEAD FINDINGS Anterior circulation: Severe stenosis to near occlusion of the bilateral proximal M2 segments (series 11, image 93/series 11, image 90). Mild narrowing in the A2 segment of the left ACA. Posterior circulation: The right PCA is occluded at the origin. Moderate to severe nearly occlusive stenosis of the P3/P4 junction of the left PCA. Venous sinuses: As permitted by contrast timing, patent. Anatomic variants: None Review of the MIP images confirms the above findings IMPRESSION: 1. Evolving right occipital lobe and left thalamic infarcts. No hemorrhage. 2. The right PCA is occluded at the origin, in keeping with patients right PCA territory infarct. 3. Moderate to severe nearly  PROGRESS NOTE    Nancy Joyce  JYN:[id number] DOB: September 05, 1926 DOA: 04/06/2023 PCP: Rolm Gala, MD   Assessment & Plan:   Principal Problem:   Syncope Active Problems:   Rhabdomyolysis   Elevated troponin   Acute right PCA stroke (HCC)   Sundowning   Memory impairment   CVA (cerebral vascular accident) (HCC)  Assessment and Plan:  Acute right PCA CVA: as per CT head & MRI brain. Continue on aspirin, plavix indefinitely  as per neuro. Holding statin. Continue w/ neuro checks. Echo shows EF 60-65%, grade I diastolic dysfunction & no atrial shunt is detected. Will need f/u outpatient w/ neuro. Neuro signed ouff   Syncope: likely secondary to CVA. Continue on tele   Memory impairment: has been worsening over the the last year as per pt's son. ?Dementia. Haldol prn    Elevated troponin: likely secondary to demand ischemia   Rhabdomyolysis: likely secondary to unwitnessed fall/syncope at home. CK is still elevated but trending down. Holding statin. Will continue to monitor       DVT prophylaxis: SCDs Code Status: DNR Family Communication: discussed pt's care w/ pt's son and answered his questions  Disposition Plan: PT/OT recs SNF   Level of care: Telemetry Medical Status is: Inpatient Remains inpatient appropriate because: severity of illness    Consultants:  Neuro   Procedures:   Antimicrobials:   Subjective: Pt c/o fatigue   Objective: Vitals:   03/29/23 1355 03/29/23 1627 03/29/23 2034 03/30/23 0404  BP:  (!) 160/52 (!) 169/70 130/63  Pulse:  79 78 78  Resp: (!) 21 18  18   Temp:  98.5 F (36.9 C)  98.9 F (37.2 C)  TempSrc:  Oral  Oral  SpO2:  96%  97%  Height:        Intake/Output Summary (Last 24 hours) at 03/30/2023 0758 Last data filed at 03/29/2023 1329 Gross per 24 hour  Intake --  Output 700 ml  Net -700 ml   There were no vitals filed for this visit.  Examination:  General exam: Appears calm & comfortable  Respiratory  system: diminished breath sounds b/l Cardiovascular system: S1/S2+. No rubs or clicks  Gastrointestinal system: abd is soft, NT, ND & hypoactive bowel sounds Central nervous system: alert & awake. Moves all extremities  Psychiatry: judgement and insight appears improved. Flat mood and affect    Data Reviewed: I have personally reviewed following labs and imaging studies  CBC: Recent Labs  Lab 04/05/2023 1459 03/28/23 0227 03/29/23 0738 03/30/23 0623  WBC 10.5 12.3* 11.2* 10.8*  NEUTROABS 9.1*  --   --   --   HGB 13.7 13.7 12.6 14.4  HCT 40.9 40.5 37.9 42.0  MCV 88.3 88.2 89.2 88.2  PLT 257 236 228 240   Basic Metabolic Panel: Recent Labs  Lab 04/06/2023 1459 03/28/23 0227 03/29/23 0738 03/30/23 0623  NA 132* 136 140 143  K 4.2 4.1 3.4* 3.5  CL 98 103 107 109  CO2 25 23 21* 23  GLUCOSE 133* 145* 98 127*  BUN 36* 32* 42* 40*  CREATININE 0.82 0.81 0.78 0.74  CALCIUM 9.0 8.9 8.8* 9.0   GFR: CrCl cannot be calculated (Unknown ideal weight.). Liver Function Tests: Recent Labs  Lab 03/25/2023 1459  AST 112*  ALT 56*  ALKPHOS 92  BILITOT 1.3*  PROT 7.3  ALBUMIN 3.6   No results for input(s): "LIPASE", "AMYLASE" in the last 168 hours. No results for input(s): "AMMONIA" in the last 168 hours. Coagulation

## 2023-03-30 NOTE — Progress Notes (Addendum)
Speech Language Pathology Treatment: Dysphagia  Patient Details Name: Nancy Joyce MRN: [id number] DOB: [birth year] Today's Date: 03/30/2023 Time: 0915-1000 SLP Time Calculation (min) (ACUTE ONLY): 45 min  Assessment / Plan / Recommendation Clinical Impression  Pt seen for ongoing assessment of swallowing and trials to upgrade diet as able to. Pt awakened, verbal (moreso than at eval); followed basic 1 step commands w/ cues given. She exhibited Mild+ Confusion overall and perseverations at times. Pt lives alone but Son/family are in the process of moving her to a living facility w/ memory care per MD report. MD suspects undiagnosed Dementia. Pt has mitts.  Pt on RA, afebrile. WBC 10.8.  Pt appears to present w/ grossly functional oropharyngeal phase swallowing but w/ min risk for oropharyngeal phase dysphagia and aspiration/aspiration pneumonia in setting of declined Cognitive status; suspected Baseline Dementia. Per MD note, "Per son, pt has been worsening over the last year.". ANY  Cognitive decline can impact overall awareness/timing of swallow and safety during po tasks which increases risk for aspiration, choking. Pt's risk for aspiration can be reduced when following general aspiration precautions and using a modified diet consistency of chopped meats/moistened foods for ease of intake.  Pt explained general aspiration precautions and agreed verbally to the need for following them especially sitting upright for all oral intake -- fully supported for upright sitting. She helped to feed herself w/ hand over hand support trials of purees and soft solids and ice chips, then thin liquids via cup/straw. NO overt clinical s/s of aspiration were noted w/ any consistency; respiratory status remained calm and unlabored, vocal quality clear b/t trials. O2 sats 98% when checked. Oral phase appeared grossly [facility] for bolus management, mastication, and timely A-P transfer for swallowing; oral clearing achieved  w/ all consistencies. Min oral prep confusion during self-feeding so feeding support offered. Pt stated she "did not want too much" but seemed to enjoy the juice.    Pt appears at reduced risk for aspriation w/ oral diet when following general aspiration precautions and provided Supervision to support her. Recommend a more Mech Soft diet for ease of soft foods w/ gravies added to moisten foods; Thin liquids -- monitor upright positioning and straw use. Recommend general aspiration precautions; reduce distractions at meals/talking. Feeding support as needed. Pills CRUSHED vs Whole in Puree. Tray setup.  Precautions posted at bedside.  ST services recommends follow w/ Palliative Care for GOC and education re: impact of Cognitive decline/Dementia on swallowing. ST services will f/u w/ pt's status while admitted for any further education.    Cognitive-communication Assessment at Bedside:  Pt was seen for Informal assessment at bedside today d/t concern for decline in her cognitive-communication abilities. Pt does have new dx of stroke per MRI as well as suspicion of Baseline Cognitive decline per chart/MD. Pt was disheveled upon entering room; assisted w/ putting her gown back on and positioning upright in bed. Pt was verbal w/ more spontaneous statements re: her needs/wants(liquids - "ice water would be good"). However, pt's responses were slow and vague to direct questions. Noted MOD+ Confusion in general responses and engagement; MD arrived to room and noted similar.  OF NOTE: Pt also reported Vision and Hearing deficits.   Pt awake, verbal and followed basic directions re: self and po's given intermittent cues. Confusion/slow to respond in verbal engagement intermittently. Per chart, Son endorsed pt has demonstrated a decline in status; "Son/family are in the process of moving her to a living facility w/ memory care", per MD  report.    Pt presents with Moderate+ Cognitive-communication impairment  characterized by deficits in the areas of attention, problem-solving, memory/recall, mental flexibility, and executive functioning per demonstration in general conversation and few questions from the SLUMS. Score was NOT calculated d/t pt's inability to complete/attend to all the tasks/questions. Pt exhibited deficits in Orientation, Fluency, and Memory despite given MOD-MAX cues; her performance would be indicative of neurocognitive deficits. Deficits may impact ability to manage medications, finances, and maintain safety in the home environment in ADLs. Suspicion of h/o Cognitive deficits may be present per Son's report. Pt would need Full Supervision, 100% Supervision and monitoring of ADL tasks.   Suspect Cognitive-linguistic impact on pt's Language abilities. She was able to answer Basic Y/N questions re: self; ID basic objects and function of objects, and followed 1-step command. Deficits in Fluency of speech and picture description noted c/b vague/limited responses lack of detail. No Motor Speech deficits appreciated.   W/ pt's current presentation, FULL, 100% Supervision in the next venue of care for safety during ADLs is recommended for safety; especially w/ medications and setup w/, and preparing/ordering, meals. Unsure of pt's previous Cognitive Baseline, but would strongly recommend f/u w/ Neurology for formal assessment of Cognition. Further Acute ST services recommended at next venue of care as determined needed. This was discussed w/ MD who was in agreement w/ plan. NSG updated.              HPI HPI: Pt is a 89 y/o F admitted on 03/17/2023 after presenting with c/o unwitnessed fall in the shower, unknown how long pt was lying on the floor.   Head CT showed Moderate chronic small vessel ischemic disease; subacute R PCA infarct.    MRI showed acute infarcts in the medial R occipital lobe & L thalamus.  CXR negative. PMH: HOH, HTN, undiagnosed Dementia.   Since admit, pt has been restless  trying to take mittens/clothes off, Confused, yelling at Staff; intermittently lethargic requiring encouragement to open eyes to engage. MD has stated suspicion of undiagnoses Dementia.         SLP Plan  Continue with current plan of care (po check Monday)      Recommendations for follow up therapy are one component of a multi-disciplinary discharge planning process, led by the attending physician.  Recommendations may be updated based on patient status, additional functional criteria and insurance authorization.    Recommendations  Diet recommendations: Dysphagia 3 (mechanical soft);Thin liquid Liquids provided via: Cup;Straw (monitor) Medication Administration: Crushed with puree Supervision: Patient able to self feed;Staff to assist with self feeding;Full supervision/cueing for compensatory strategies (d/t Dementia) Compensations: Minimize environmental distractions;Slow rate;Small sips/bites;Lingual sweep for clearance of pocketing;Follow solids with liquid Postural Changes and/or Swallow Maneuvers: Out of bed for meals;Seated upright 90 degrees;Upright 30-60 min after meal                 (Dietician; Palliative Care for GOC) Oral care BID;Oral care before and after PO;Staff/trained caregiver to provide oral care   Frequent or constant Supervision/Assistance (d/t Dementia) Dysphagia, unspecified (R13.10) (impact of Dementia)     Continue with current plan of care (po check Monday)         Jerilynn Som, MS, CCC-SLP Speech Language Pathologist Rehab Services; [facility] [place] [phone] (ascom) Duilio Heritage  03/30/2023, 12:35 PM

## 2023-03-31 DIAGNOSIS — I6389 Other cerebral infarction: Secondary | ICD-10-CM | POA: Diagnosis not present

## 2023-03-31 LAB — BASIC METABOLIC PANEL
Anion gap: 13 (ref 5–15)
BUN: 43 mg/dL — ABNORMAL HIGH (ref 8–23)
CO2: 23 mmol/L (ref 22–32)
Calcium: 8.8 mg/dL — ABNORMAL LOW (ref 8.9–10.3)
Chloride: 107 mmol/L (ref 98–111)
Creatinine, Ser: 0.68 mg/dL (ref 0.44–1.00)
GFR, Estimated: >60 mL/min (ref >60)
Glucose, Bld: 115 mg/dL — ABNORMAL HIGH (ref 70–99)
Potassium: 3.1 mmol/L — ABNORMAL LOW (ref 3.5–5.1)
Sodium: 143 mmol/L (ref 135–145)

## 2023-03-31 LAB — CBC
HCT: 42.1 % (ref 36.0–46.0)
Hemoglobin: 14.3 g/dL (ref 12.0–15.0)
MCH: 29.7 pg (ref 26.0–34.0)
MCHC: 34 g/dL (ref 30.0–36.0)
MCV: 87.5 fL (ref 80.0–100.0)
Platelets: 238 10*3/uL (ref 150–400)
RBC: 4.81 MIL/uL (ref 3.87–5.11)
RDW: 14.6 % (ref 11.5–15.5)
WBC: 10.1 10*3/uL (ref 4.0–10.5)
nRBC: 0 % (ref 0.0–0.2)

## 2023-03-31 LAB — CK: Total CK: 898 U/L — ABNORMAL HIGH (ref 38–234)

## 2023-03-31 MED ORDER — POTASSIUM CHLORIDE CRYS ER 20 MEQ PO TBCR
40.0000 meq | EXTENDED_RELEASE_TABLET | Freq: Two times a day (BID) | ORAL | Status: AC
  Administered 2023-03-31 (×2): 40 meq via ORAL
  Filled 2023-03-31 (×2): qty 2

## 2023-03-31 NOTE — Progress Notes (Signed)
Physical Therapy Treatment Patient Details Name: Nancy Joyce MRN: [id number] DOB: February 11, 1927 Today's Date: 03/31/2023   History of Present Illness Pt is a 89 y/o F admitted on 04/09/2023 after presenting with c/o unwitnessed fall in the shower, unknown how long pt was lying on the floor. CT showed subacute R PCA infarct. MRI showed acute infarcts in the medial R occipital lobe & L thalamus. Pt is being treated for syncope. PMH: HTN    PT Comments  Pt in bed, awakens to voice.  She is assisted to EOB with mod a x 1 and generally resists scooting forward in bed to get her feet on the floor.  She keeps talking about cats and not wanting them in her room.  Reassured there were no cats here but remains fixated on cats during session "I wish Gery Pray was here to take care of them".  She does sit for several minutes and does stand x 1 during session.  Attempted to get pt to sidestep up in bed but only takes 1 step before sitting.  Tries to lay head at foot of bed and becomes agitated when redirected.  She does end up putting her head towards pillow and needs mod a x 1 to lay down.  She does have some BM during session and tech is called and assists with care and donning clean sheets.     If plan is discharge home, recommend the following: Two people to help with walking and/or transfers;Two people to help with bathing/dressing/bathroom;Direct supervision/assist for medications management;Help with stairs or ramp for entrance;Assist for transportation;Assistance with feeding;Assistance with cooking/housework;Direct supervision/assist for financial management;Supervision due to cognitive status   Can travel by private vehicle        Equipment Recommendations       Recommendations for Other Services       Precautions / Restrictions Precautions Precautions: Fall Restrictions Weight Bearing Restrictions: No     Mobility  Bed Mobility Overal bed mobility: Needs Assistance Bed Mobility: Supine to  Sit, Sit to Supine     Supine to sit: Mod assist Sit to supine: Mod assist     Patient Response: Restless  Transfers Overall transfer level: Needs assistance Equipment used: Rolling walker (2 wheels) Transfers: Sit to/from Stand Sit to Stand: Min assist           General transfer comment: unable to take any functional steps for transfer    Ambulation/Gait     Assistive device: Rolling walker (2 wheels)         General Gait Details: fatigued quickly and only takes 1 sidestep with heavy cues - not functional gait   Stairs             Wheelchair Mobility     Tilt Bed Tilt Bed Patient Response: Restless  Modified Rankin (Stroke Patients Only)       Balance Overall balance assessment: Needs assistance Sitting-balance support: Feet supported Sitting balance-Leahy Scale: Fair       Standing balance-Leahy Scale: Poor Standing balance comment: +1 hands on assist for standing.                            Cognition Arousal: Alert Behavior During Therapy: Agitated Overall Cognitive Status: No family/caregiver present to determine baseline cognitive functioning  General Comments: agitated about cats in her room - reassured there were not cats here.        Exercises      General Comments        Pertinent Vitals/Pain Pain Assessment Pain Assessment: Faces Faces Pain Scale: Hurts little more Pain Location: bilateral legs Pain Descriptors / Indicators: Sore Pain Intervention(s): Limited activity within patient's tolerance, Monitored during session, Repositioned    Home Living                          Prior Function            PT Goals (current goals can now be found in the care plan section) Progress towards PT goals: Not progressing toward goals - comment    Frequency    Min 1X/week      PT Plan      Co-evaluation              AM-PAC PT "6 Clicks"  Mobility   Outcome Measure  Help needed turning from your back to your side while in a flat bed without using bedrails?: Total Help needed moving from lying on your back to sitting on the side of a flat bed without using bedrails?: A Lot Help needed moving to and from a bed to a chair (including a wheelchair)?: Total Help needed standing up from a chair using your arms (e.g., wheelchair or bedside chair)?: A Lot Help needed to walk in hospital room?: Total Help needed climbing 3-5 steps with a railing? : Total 6 Click Score: 8    End of Session   Activity Tolerance: Treatment limited secondary to agitation Patient left: in bed;with call bell/phone within reach;with bed alarm set Nurse Communication: Mobility status PT Visit Diagnosis: Muscle weakness (generalized) (M62.81);Difficulty in walking, not elsewhere classified (R26.2);Unsteadiness on feet (R26.81);Other abnormalities of gait and mobility (R26.89)     Time: 5956-3875 PT Time Calculation (min) (ACUTE ONLY): 17 min  Charges:    $Therapeutic Activity: 8-22 mins PT General Charges $$ ACUTE PT VISIT: 1 Visit                   Danielle Dess, PTA 03/31/23, 11:00 AM

## 2023-03-31 NOTE — [place] FL2 (Signed)
[place] MEDICAID FL2 LEVEL OF CARE FORM     IDENTIFICATION  Patient Name: Nancy Joyce Birthdate: [birth year] Sex: female Admission Date (Current Location): 03-29-2023  [facility] and [place] Number:  Chiropodist and Address:  [facility], [address], [place], [place] [address]      Provider Number: [id number]  Attending Physician Name and Address:  Charise Killian, MD  Relative Name and Phone Number:  Nicky Pugh [facility])  [phone] [facility])    Current Level of Care: Hospital Recommended Level of Care: Skilled Nursing Facility Prior Approval Number:    Date Approved/Denied:   PASRR Number: [id number]  Discharge Plan:      Current Diagnoses: Patient Active Problem List   Diagnosis Date Noted   CVA (cerebral vascular accident) (HCC) 03/28/2023   Rhabdomyolysis 03/29/23   Syncope 2023-03-29   Elevated troponin 03/29/23   Acute right PCA stroke (HCC) 29-Mar-2023   Sundowning 2023-03-29   Memory impairment 2023/03/29    Orientation RESPIRATION BLADDER Height & Weight     Self  Normal Incontinent Weight:   Height:  5\' 2"  (157.5 cm)  BEHAVIORAL SYMPTOMS/MOOD NEUROLOGICAL BOWEL NUTRITION STATUS      Continent Diet (dys 3)  AMBULATORY STATUS COMMUNICATION OF NEEDS Skin   Extensive Assist Verbally Skin abrasions, Bruising                       Personal Care Assistance Level of Assistance  Bathing, Feeding, Dressing Bathing Assistance: Maximum assistance Feeding assistance: Maximum assistance Dressing Assistance: Maximum assistance     Functional Limitations Info             SPECIAL CARE FACTORS FREQUENCY  PT (By licensed PT), OT (By licensed OT), Speech therapy     PT Frequency: 5 times per week OT Frequency: 5 times per week            Contractures      Additional Factors Info  Code Status, Allergies Code Status Info: DNR-Limited Allergies Info: Ropinirole, Cetirizine, Montelukast            Current Medications (03/31/2023):  This is the current hospital active medication list Current Facility-Administered Medications  Medication Dose Route Frequency Provider Last Rate Last Admin   acetaminophen (TYLENOL) tablet 650 mg  650 mg Oral Q6H PRN Cox, Amy N, DO   650 mg at 03/29/23 1310   Or   acetaminophen (TYLENOL) suppository 650 mg  650 mg Rectal Q6H PRN Cox, Amy N, DO       aspirin EC tablet 81 mg  81 mg Oral Daily Otelia Sergeant, RPH   81 mg at 03/31/23 0813   clopidogrel (PLAVIX) tablet 75 mg  75 mg Oral Daily Caryl Pina, MD   75 mg at 03/31/23 0813   haloperidol (HALDOL) tablet 2 mg  2 mg Oral Q6H PRN Charise Killian, MD       Or   haloperidol lactate (HALDOL) injection 2 mg  2 mg Intramuscular Q6H PRN Charise Killian, MD       melatonin tablet 5 mg  5 mg Oral QHS PRN Cox, Amy N, DO       nitroGLYCERIN (NITROGLYN) 2 % ointment 1 inch  1 inch Topical Q6H PRN Cox, Amy N, DO       ondansetron (ZOFRAN) tablet 4 mg  4 mg Oral Q6H PRN Cox, Amy N, DO       Or   ondansetron (ZOFRAN) injection  4 mg  4 mg Intravenous Q6H PRN Cox, Amy N, DO       potassium chloride SA (KLOR-CON M) CR tablet 40 mEq  40 mEq Oral BID Charise Killian, MD   40 mEq at 03/31/23 0272   senna-docusate (Senokot-S) tablet 1 tablet  1 tablet Oral QHS PRN Cox, Amy N, DO         Discharge Medications: Please see discharge summary for a list of discharge medications.  Relevant Imaging Results:  Relevant Lab Results:   Additional Information SS #: [id number]  Jazma Pickel E Nyhla Mountjoy, LCSW

## 2023-03-31 NOTE — Progress Notes (Signed)
Patient noted to have increase  in restlessness this shift.  Has not slept all night and has attempted to leave her bed constantly.  This behavior and our inability to redirect required someone to sit in  her room to keep her safe.

## 2023-03-31 NOTE — TOC Progression Note (Signed)
Transition of Care [facility]) - Progression Note    Patient Details  Name: Nancy Joyce MRN: [id number] Date of Birth: 03-08-1927  [facility]) CM/SW Contact  Liliana Cline, LCSW Phone Number: 03/31/2023, 3:57 PM  Clinical Narrative:    Requested PT work with patient this morning. PT worked with patient and recommendation remains SNF. CSW called and spoke with patient's son [name] defers to his wife [name]. Spoke to [name]. Explained SNF recommendation. She is agreeable, her top choice is [facility]. SNF work up started.    Expected Discharge Plan: Skilled Nursing Facility Barriers to Discharge: Continued Medical Work up  Expected Discharge Plan and Services       Living arrangements for the past 2 months: Single Family Home                                       Social Determinants of Health (SDOH) Interventions SDOH Screenings   Food Insecurity: No Food Insecurity (03/28/2023)  Housing: Low Risk  (03/28/2023)  Transportation Needs: No Transportation Needs (03/28/2023)  Utilities: Not At Risk (03/28/2023)  Tobacco Use: Low Risk  (04-12-23)    Readmission Risk Interventions     No data to display

## 2023-03-31 NOTE — Progress Notes (Signed)
PROGRESS NOTE    Nancy Joyce  KGM:[id number] DOB: [birth year] DOA: 04/01/2023 PCP: Rolm Gala, MD   Assessment & Plan:   Principal Problem:   Syncope Active Problems:   Rhabdomyolysis   Elevated troponin   Acute right PCA stroke (HCC)   Sundowning   Memory impairment   CVA (cerebral vascular accident) (HCC)  Assessment and Plan:  Acute right PCA CVA: as per CT head & MRI brain. Continue on aspirin, plavix indefinitely as per neuro. Holding statin. Continue w/ neuro checks. Echo shows EF 60-65%, grade I diastolic dysfunction & no atrial shunt is detected. Will need f/u outpatient w/ neuro. Neuro signed ouff   Syncope: likely secondary to CVA.   Memory impairment: has been worsening over the the last year as per pt's son. ?Dementia. Haldol prn    Elevated troponin: likely secondary to demand ischemia    Rhabdomyolysis: likely secondary to unwitnessed fall/syncope at home. CK is trending down from day prior. Holding statin. Will continue to monitor     DVT prophylaxis: SCDs Code Status: DNR Family Communication:  Disposition Plan: PT/OT recs SNF   Level of care: Telemetry Medical Status is: Inpatient Remains inpatient appropriate because: severity of illness    Consultants:  Neuro   Procedures:   Antimicrobials:   Subjective: Pt is pleasantly confused   Objective: Vitals:   03/30/23 1711 03/30/23 2009 03/31/23 0451 03/31/23 0734  BP: (!) 144/65 (!) 165/65 (!) 163/78 (!) 159/75  Pulse: 83 84 87 85  Resp: 16 18 18 20   Temp: 98.6 F (37 C) 97.7 F (36.5 C) (!) 97.4 F (36.3 C) (!) 97.5 F (36.4 C)  TempSrc:  Oral    SpO2: 99% 97% 98% 99%  Height:        Intake/Output Summary (Last 24 hours) at 03/31/2023 0756 Last data filed at 03/30/2023 1849 Gross per 24 hour  Intake 0 ml  Output 700 ml  Net -700 ml   There were no vitals filed for this visit.  Examination:  General exam: appears calm & comfortable  Respiratory system: diminished  breath sounds b/l  Cardiovascular system: S1/S2+. No rubs or clicks Gastrointestinal system: abd is soft, NT, ND & hypoactive bowel sounds  Central nervous system: alert & awake. Moves all extremities  Psychiatry: judgement and insight appears poor. Flat mood and affect     Data Reviewed: I have personally reviewed following labs and imaging studies  CBC: Recent Labs  Lab 03/17/2023 1459 03/28/23 0227 03/29/23 0738 03/30/23 0623 03/31/23 0503  WBC 10.5 12.3* 11.2* 10.8* 10.1  NEUTROABS 9.1*  --   --   --   --   HGB 13.7 13.7 12.6 14.4 14.3  HCT 40.9 40.5 37.9 42.0 42.1  MCV 88.3 88.2 89.2 88.2 87.5  PLT 257 236 228 240 238   Basic Metabolic Panel: Recent Labs  Lab 04/07/2023 1459 03/28/23 0227 03/29/23 0738 03/30/23 0623 03/31/23 0503  NA 132* 136 140 143 143  K 4.2 4.1 3.4* 3.5 3.1*  CL 98 103 107 109 107  CO2 25 23 21* 23 23  GLUCOSE 133* 145* 98 127* 115*  BUN 36* 32* 42* 40* 43*  CREATININE 0.82 0.81 0.78 0.74 0.68  CALCIUM 9.0 8.9 8.8* 9.0 8.8*   GFR: CrCl cannot be calculated (Unknown ideal weight.). Liver Function Tests: Recent Labs  Lab 03/19/2023 1459  AST 112*  ALT 56*  ALKPHOS 92  BILITOT 1.3*  PROT 7.3  ALBUMIN 3.6   No results for input(s): "  LIPASE", "AMYLASE" in the last 168 hours. No results for input(s): "AMMONIA" in the last 168 hours. Coagulation Profile: Recent Labs  Lab 04-20-2023 1459  INR 1.1   Cardiac Enzymes: Recent Labs  Lab 20-Apr-2023 1459 03/28/23 0227 03/29/23 0737 03/30/23 0623 03/31/23 0503  CKTOTAL 2,445* 2,571* 1,462* 1,580* 898*   BNP (last 3 results) No results for input(s): "PROBNP" in the last 8760 hours. HbA1C: No results for input(s): "HGBA1C" in the last 72 hours.  CBG: Recent Labs  Lab 2023/04/20 1459  GLUCAP 120*   Lipid Profile: No results for input(s): "CHOL", "HDL", "LDLCALC", "TRIG", "CHOLHDL", "LDLDIRECT" in the last 72 hours.  Thyroid Function Tests: No results for input(s): "TSH", "T4TOTAL",  "FREET4", "T3FREE", "THYROIDAB" in the last 72 hours. Anemia Panel: No results for input(s): "VITAMINB12", "FOLATE", "FERRITIN", "TIBC", "IRON", "RETICCTPCT" in the last 72 hours. Sepsis Labs: No results for input(s): "PROCALCITON", "LATICACIDVEN" in the last 168 hours.  No results found for this or any previous visit (from the past 240 hour(s)).       Radiology Studies: No results found.      Scheduled Meds:  aspirin EC  81 mg Oral Daily   clopidogrel  75 mg Oral Daily   potassium chloride  40 mEq Oral BID   Continuous Infusions:     LOS: 4 days        Charise Killian, MD [facility] Pager [phone]  If 7PM-7AM, please contact night-coverage [email or web address] 03/31/2023, 7:56 AM

## 2023-04-01 DIAGNOSIS — I6389 Other cerebral infarction: Secondary | ICD-10-CM | POA: Diagnosis not present

## 2023-04-01 DIAGNOSIS — K922 Gastrointestinal hemorrhage, unspecified: Secondary | ICD-10-CM | POA: Diagnosis not present

## 2023-04-01 DIAGNOSIS — R578 Other shock: Secondary | ICD-10-CM

## 2023-04-01 LAB — BASIC METABOLIC PANEL
Anion gap: 13 (ref 5–15)
BUN: 55 mg/dL — ABNORMAL HIGH (ref 8–23)
CO2: 20 mmol/L — ABNORMAL LOW (ref 22–32)
Calcium: 9.3 mg/dL (ref 8.9–10.3)
Chloride: 115 mmol/L — ABNORMAL HIGH (ref 98–111)
Creatinine, Ser: 0.79 mg/dL (ref 0.44–1.00)
GFR, Estimated: >60 mL/min (ref >60)
Glucose, Bld: 117 mg/dL — ABNORMAL HIGH (ref 70–99)
Potassium: 4.1 mmol/L (ref 3.5–5.1)
Sodium: 148 mmol/L — ABNORMAL HIGH (ref 135–145)

## 2023-04-01 LAB — CBC
HCT: 47.2 % — ABNORMAL HIGH (ref 36.0–46.0)
Hemoglobin: 15.2 g/dL — ABNORMAL HIGH (ref 12.0–15.0)
MCH: 29.3 pg (ref 26.0–34.0)
MCHC: 32.2 g/dL (ref 30.0–36.0)
MCV: 90.9 fL (ref 80.0–100.0)
Platelets: 256 10*3/uL (ref 150–400)
RBC: 5.19 MIL/uL — ABNORMAL HIGH (ref 3.87–5.11)
RDW: 14.9 % (ref 11.5–15.5)
WBC: 12.4 10*3/uL — ABNORMAL HIGH (ref 4.0–10.5)
nRBC: 0 % (ref 0.0–0.2)

## 2023-04-01 LAB — URINALYSIS, COMPLETE (UACMP) WITH MICROSCOPIC
Bilirubin Urine: NEGATIVE
Glucose, UA: NEGATIVE mg/dL
Ketones, ur: NEGATIVE mg/dL
Leukocytes,Ua: NEGATIVE
Nitrite: NEGATIVE
Protein, ur: 100 mg/dL — AB
RBC / HPF: 0 RBC/hpf (ref 0–5)
Specific Gravity, Urine: 1.02 (ref 1.005–1.030)
Squamous Epithelial / HPF: 0 /[HPF] (ref 0–5)
WBC, UA: 0 WBC/hpf (ref 0–5)
pH: 8 (ref 5.0–8.0)

## 2023-04-01 LAB — GLUCOSE, CAPILLARY: Glucose-Capillary: 117 mg/dL — ABNORMAL HIGH (ref 70–99)

## 2023-04-01 LAB — CK: Total CK: 319 U/L — ABNORMAL HIGH (ref 38–234)

## 2023-04-01 MED ORDER — PANTOPRAZOLE SODIUM 40 MG IV SOLR: 40.0000 mg | Freq: Two times a day (BID) | INTRAVENOUS | Status: DC

## 2023-04-01 MED ORDER — GLYCOPYRROLATE 0.2 MG/ML IJ SOLN: 0.2000 mg | INTRAMUSCULAR | Status: DC | PRN

## 2023-04-01 MED ORDER — ACETAMINOPHEN 325 MG PO TABS: 650.0000 mg | ORAL_TABLET | Freq: Four times a day (QID) | ORAL | Status: DC | PRN

## 2023-04-01 MED ORDER — PANTOPRAZOLE SODIUM 40 MG IV SOLR: 40.0000 mg | INTRAVENOUS | Status: DC

## 2023-04-01 MED ORDER — POLYVINYL ALCOHOL 1.4 % OP SOLN: 1.0000 [drp] | Freq: Four times a day (QID) | OPHTHALMIC | Status: DC | PRN

## 2023-04-01 MED ORDER — ACETAMINOPHEN 650 MG RE SUPP: 650.0000 mg | Freq: Four times a day (QID) | RECTAL | Status: DC | PRN

## 2023-04-01 MED ORDER — MORPHINE SULFATE (CONCENTRATE) 10 MG/0.5ML PO SOLN
10.0000 mg | ORAL | Status: DC | PRN
  Administered 2023-04-02 (×5): 10 mg via SUBLINGUAL
  Filled 2023-04-01 (×5): qty 0.5

## 2023-04-01 MED ORDER — GLYCOPYRROLATE 1 MG PO TABS: 1.0000 mg | ORAL_TABLET | ORAL | Status: DC | PRN

## 2023-04-01 MED ORDER — LORAZEPAM 2 MG/ML PO CONC
1.0000 mg | ORAL | Status: DC | PRN
  Administered 2023-04-02: 1 mg via SUBLINGUAL
  Filled 2023-04-01: qty 1

## 2023-04-01 NOTE — TOC Progression Note (Signed)
Transition of Care [facility]) - Progression Note    Patient Details  Name: Nancy Joyce MRN: [id number] Date of Birth: Mar 13, 1927  [facility]) CM/SW Contact  Garret Reddish, RN Phone Number: 04/01/2023, 9:39 PM  Clinical Narrative:    Chart reviewed.  No bed offers at this time.  I have asked [facility] and [address] [facility] to look at patient.  The above facilities are in the pending status.    I have reached out to patient's son to update on bed offers and I was unable to reach patient's sons to provide and update.  TOC will continue with placement efforts.     Expected Discharge Plan: Skilled Nursing Facility Barriers to Discharge: Continued Medical Work up  Expected Discharge Plan and Services       Living arrangements for the past 2 months: Single Family Home                                       Social Determinants of Health (SDOH) Interventions SDOH Screenings   Food Insecurity: No Food Insecurity (03/28/2023)  Housing: Low Risk  (03/28/2023)  Transportation Needs: No Transportation Needs (03/28/2023)  Utilities: Not At Risk (03/28/2023)  Tobacco Use: Low Risk  (13-Apr-2023)    Readmission Risk Interventions     No data to display

## 2023-04-01 NOTE — Significant Event (Signed)
Patient Name: Nancy Joyce           DOB: August 17, 1926  MRN: [id number]      Admission Date: 04/05/2023  Attending Provider: Charise Killian, MD  Primary Diagnosis: Syncope   Level of care: Telemetry Medical   PROGRESS REPORT  Rapid response was called at approximately 18:30 due to new onset rectal bleeding.  Discussed with both patient's RN Molli Hazard, and rapid response nurse Maralyn Sago.  Patient had new onset dark maroon blood per rectum that saturated approximately 75% of her bed.  Initial vital signs with heart rate of 98 and blood pressure 117/106.  Patient was altered but this is her baseline.  Physical examination: General: Pale, chronic chronically ill-appearing elderly female.  Cardiac: Low-grade tachycardia with regular rate Rectal: 2 external hemorrhoids with no evidence of active bleeding.  DRE performed with evidence of dark brown stool in rectal vault unable to palpate any internal hemorrhoids.  On completion of DRE, patient had actively flowing maroon blood per rectum with clots. Neuro:  Altered and nonresponsive to questions. Moving extremities spontaneously and responsive to noxious stimuli Skin: Notably increased capillary refill  Assessment/Plan:   # Lower GI Bleed # Hemorrhagic Shock  Overall, patient's examination and presentation is consistent with a lower GI bleed, potentially diverticular in nature.  She is on DAPT for recent CVA placing her at increased risk.  By the end of examination, patient's blood pressure was rapidly decreasing as low as 76/52, concerning for developing hemorrhagic shock.  IV was attempted to be placed by ICU RN however patient is a very difficult stick. Discussed with Dr. Mayford Knife on utility of CTA abdomen with blood transfusion. Dr. Mayford Knife reached out to patient's family.  Decision was made to transition to comfort measures only.  Critical Care Time: 30 minutes  [facility]

## 2023-04-01 NOTE — Progress Notes (Signed)
Contacted by daughter in law, Etheleen Nicks. Concerns about not voiding on her own, also waiting to hear from [facility] about placement. Second UA completed early am when patient was unable to void. Reached out to Dr. Clinton Sawyer about potential UTI and inability to void.

## 2023-04-01 NOTE — Progress Notes (Signed)
Per Dr Mayford Knife, dc NIH scale/stroke orders

## 2023-04-01 NOTE — Progress Notes (Signed)
PROGRESS NOTE    Nancy Joyce  ZOX:[id number] DOB: [birth year] DOA: 04/17/23 PCP: Rolm Gala, MD   Assessment & Plan:   Principal Problem:   Syncope Active Problems:   Rhabdomyolysis   Elevated troponin   Acute right PCA stroke (HCC)   Sundowning   Memory impairment   CVA (cerebral vascular accident) (HCC)  Assessment and Plan:  Acute right PCA CVA: as per CT head & MRI brain. Continue on aspirin, plavix indefinitely as per neuro. Holding statin. Echo shows EF 60-65%, grade I diastolic dysfunction & no atrial shunt is detected. Will need f/u outpatient w/ neuro. Neuro signed ouff   Syncope: likely secondary to CVA.   Memory impairment: has been worsening over the the last year as per pt's son. ?Dementia. Haldol prn     Elevated troponin: likely secondary to demand ischemia   Rhabdomyolysis: likely secondary to unwitnessed fall/syncope at home. CK is still elevated but trending down. Holding statin. Will continue to monitor     DVT prophylaxis: SCDs Code Status: DNR Family Communication:  Disposition Plan: PT/OT recs SNF   Level of care: Telemetry Medical Status is: Inpatient Remains inpatient appropriate because: medically stable. Waiting on SNF placement     Consultants:  Neuro   Procedures:   Antimicrobials:   Subjective: Pt is still pleasantly confused   Objective: Vitals:   03/31/23 1216 03/31/23 1526 03/31/23 2021 04/01/23 0449  BP: (!) 160/74 (!) 157/70 (!) 158/81 (!) 152/70  Pulse: 77 81 93 95  Resp: 18 20 16 18   Temp: 98 F (36.7 C) (!) 97.5 F (36.4 C) 98.3 F (36.8 C) (!) 97.5 F (36.4 C)  TempSrc: Oral  Oral   SpO2: 100% 97% 95% 100%  Height:        Intake/Output Summary (Last 24 hours) at 04/01/2023 0809 Last data filed at 04/01/2023 0229 Gross per 24 hour  Intake --  Output 650 ml  Net -650 ml   There were no vitals filed for this visit.  Examination:  General exam: appears calm but confused  Respiratory system:  decreased breath sounds b/l Cardiovascular system: S1 & S2+. No rubs or clicks  Gastrointestinal system: abd is soft, NT, ND & hypoactive bowel sounds Central nervous system: alert & awake. Moves all extremities  Psychiatry: judgement and insight appears poor. Flat mood and affect    Data Reviewed: I have personally reviewed following labs and imaging studies  CBC: Recent Labs  Lab 2023-04-17 1459 03/28/23 0227 03/29/23 0738 03/30/23 0623 03/31/23 0503 04/01/23 0628  WBC 10.5 12.3* 11.2* 10.8* 10.1 12.4*  NEUTROABS 9.1*  --   --   --   --   --   HGB 13.7 13.7 12.6 14.4 14.3 15.2*  HCT 40.9 40.5 37.9 42.0 42.1 47.2*  MCV 88.3 88.2 89.2 88.2 87.5 90.9  PLT 257 236 228 240 238 256   Basic Metabolic Panel: Recent Labs  Lab 03/28/23 0227 03/29/23 0738 03/30/23 0623 03/31/23 0503 04/01/23 0628  NA 136 140 143 143 148*  K 4.1 3.4* 3.5 3.1* 4.1  CL 103 107 109 107 115*  CO2 23 21* 23 23 20*  GLUCOSE 145* 98 127* 115* 117*  BUN 32* 42* 40* 43* 55*  CREATININE 0.81 0.78 0.74 0.68 0.79  CALCIUM 8.9 8.8* 9.0 8.8* 9.3   GFR: CrCl cannot be calculated (Unknown ideal weight.). Liver Function Tests: Recent Labs  Lab 04-17-2023 1459  AST 112*  ALT 56*  ALKPHOS 92  BILITOT 1.3*  PROT  7.3  ALBUMIN 3.6   No results for input(s): "LIPASE", "AMYLASE" in the last 168 hours. No results for input(s): "AMMONIA" in the last 168 hours. Coagulation Profile: Recent Labs  Lab 03/21/2023 1459  INR 1.1   Cardiac Enzymes: Recent Labs  Lab 03/28/23 0227 03/29/23 0737 03/30/23 0623 03/31/23 0503 04/01/23 0628  CKTOTAL 2,571* 1,462* 1,580* 898* 319*   BNP (last 3 results) No results for input(s): "PROBNP" in the last 8760 hours. HbA1C: No results for input(s): "HGBA1C" in the last 72 hours.  CBG: Recent Labs  Lab 04/03/2023 1459  GLUCAP 120*   Lipid Profile: No results for input(s): "CHOL", "HDL", "LDLCALC", "TRIG", "CHOLHDL", "LDLDIRECT" in the last 72 hours.  Thyroid  Function Tests: No results for input(s): "TSH", "T4TOTAL", "FREET4", "T3FREE", "THYROIDAB" in the last 72 hours. Anemia Panel: No results for input(s): "VITAMINB12", "FOLATE", "FERRITIN", "TIBC", "IRON", "RETICCTPCT" in the last 72 hours. Sepsis Labs: No results for input(s): "PROCALCITON", "LATICACIDVEN" in the last 168 hours.  No results found for this or any previous visit (from the past 240 hour(s)).       Radiology Studies: No results found.      Scheduled Meds:  aspirin EC  81 mg Oral Daily   clopidogrel  75 mg Oral Daily   Continuous Infusions:     LOS: 5 days        Charise Killian, MD [facility] Pager [phone]  If 7PM-7AM, please contact night-coverage [email or web address] 04/01/2023, 8:09 AM

## 2023-04-01 NOTE — Significant Event (Signed)
Rapid Response Event Note   Reason for Call :  New onset rectal bleeding  Initial Focused Assessment:  Rapid response RN arrived in patient's room with patient lying on her left side surrounded by her nurse Molli Hazard and other 1C staff. Dr. Huel Cote arrived shortly after this RN to bedside. Per Molli Hazard RN, patient had new onset of rectal bleeding with large amount of dark red blood. Blood continued to emerge from rectum of patient during assessment. Patient alert but not talkative, at her baseline per her nurse. Initial BP 117/106 and heart rate 87. Mucous membranes pale. During review of patient with Molli Hazard, Dr. Huel Cote at bedside, and Dr. Mayford Knife via phone, rechecked BP at 76/52 MAP 60, heart rate 104 regular, oxygen saturations 98% on room air, and RR 24 unlabored.  Interventions:  Attempted placement of IV but unsuccessful.  Plan of Care:  Dr. Huel Cote reaching out to family to determine plan of care. 1C staff to reach back out to rapid response team if needed.  Event Summary:   MD Notified: Dr. Mayford Knife and Dr. Huel Cote Call Time: 18:17 Arrival Time: 18:19 End Time: 18:45  Bertel Venard, Theresia Lo, RN

## 2023-04-01 NOTE — Plan of Care (Addendum)
NT came into room to find 75% of the bed saturated in mainly dark red blood.  Myself, RN, was called into the room and began to assess further.  Once I realized the origin was flowing from the patient's rectum - we proceeded to change the bed and clean the patient up.  Patient's vitals we faurly stable (98HR, 117/106, AMS (baseline) 94% RA and 18RR. ) Hospitalist was also informed and suggested to please place Hemoglobin labs. Dr. Theressa Millard and also stopped plavix and ASA.  Unfortunately, we then noted that the patient had continued to bleed and was saturating the sheets again.  Due to the quantity and continual flow, a rapid was then called to assist.  ICU chrg and an ED Hospitalist arrived at bedside to assist. Dr. Edilia Bo that bleeding probably was not coming from hemmroids, reviewed the chart and assessed for safety.  Patient's hospitalist informed of situation and agreed to contact patient son to discuss the next [plan of action.  Hospitalist informed that son and family agreed to place on immediate comfort care status and were choosing not to address the bleeding concerns further. (orders placed). Also placed order for foley to be placed.

## 2023-04-01 NOTE — Progress Notes (Signed)
Daughter in law reached out again stating Dr. Clinton Sawyer had called them. Nancy Joyce is bleeding from the rectum and asking if they wanted to scope and treat symptoms. Family wants her to be made comfort care. Orders placed. Requesting order foley for end of life care. Will follow up with family tomorrow.

## 2023-04-02 DIAGNOSIS — R627 Adult failure to thrive: Secondary | ICD-10-CM | POA: Diagnosis not present

## 2023-04-02 DIAGNOSIS — I6389 Other cerebral infarction: Secondary | ICD-10-CM | POA: Diagnosis not present

## 2023-04-02 MED ORDER — MORPHINE 100MG IN NS 100ML (1MG/ML) PREMIX INFUSION
5.0000 mg/h | INTRAVENOUS | Status: DC
  Administered 2023-04-02: 5 mg/h via INTRAVENOUS
  Filled 2023-04-02: qty 100

## 2023-04-12 NOTE — Progress Notes (Signed)
PROGRESS NOTE   HPI was taken from Dr. Sedalia Muta: Ms. Nancy Joyce is a 89 year old female with history of hypertension who presents emergency department for chief concerns of unwitnessed fall in the shower.   Unknown as to how long she was laying on the floor.   Initial vitals in the ED showed temperature of 98, respiration rate of 16, heart rate of 71, blood pressure 109/54, SpO2 98% on room air.   Serum sodium is 132, potassium 4.2, chloride 98, bicarb 25, BUN of 36, serum creatinine of 2.82, EGFR greater than 60, nonfasting glucose 133, WBC 10.5, hemoglobin 13.7, platelets of 257.   CK is elevated at 2445.   CT head and cervical spine without contrast: Was read as acute to subacute right PCA infarct.  No acute intracranial hemorrhage.  Moderate chronic small vessel ischemic disease.  No acute cervical spine fracture.   ED treatment: Sodium chloride 1 L bolus. ---------------------------- At bedside, patient awake and not alert to self, age, location, current calendar year.   Patient appears to be sundowning.  Patient is agitated and confused and yelling at staff and myself.   Social history: At home on her own.  Son denies history of tobacco, EtOH, recreational drug use.  She formally smoked cigarettes probably more than 50 years ago.  Patient is retired.   As per Dr. Mayford Knife 10/17-05-07-2022: Pt was found to have a CVA on admission and pt was started on aspirin and plavix as per neuro. Statin was held secondary to rhabdomyolysis. Unfortunately, pt had a likely lower GI bleed on 04/01/23 and after a discussion w/ pt's MPOA, Gery Pray, pt was made comfort care only.    Nancy Joyce  YQM:[id number] DOB: 1926-10-21 DOA: 03/13/2023 PCP: Rolm Gala, MD   Assessment & Plan:   Principal Problem:   Syncope Active Problems:   Rhabdomyolysis   Elevated troponin   Acute right PCA stroke (HCC)   Sundowning   Memory impairment   CVA (cerebral vascular accident) (HCC)   Lower GI bleed    Hemorrhagic shock (HCC)  Assessment and Plan: Failure to thrive: secondary to all below. Continue w/ comfort care only   Likely GI bleed: etiology unclear, possibly lower GI bleed. After discussion w/ the pt MPOA, pt's son, Gery Pray. Pt was made comfort care only.   Acute right PCA CVA: as per CT head & MRI brain. D/c aspirin, plavix. D/c statin. Echo shows EF 60-65%, grade I diastolic dysfunction & no atrial shunt is detected. Will need f/u outpatient w/ neuro. Neuro signed ouff   Syncope: likely secondary to CVA.   Memory impairment: has been worsening over the the last year as per pt's son. ?Dementia. Haldol prn     Elevated troponin: likely secondary to demand ischemia   Rhabdomyolysis: likely secondary to unwitnessed fall/syncope at home. Comfort care only     DVT prophylaxis: SCDs Code Status: DNR Family Communication:  Disposition Plan: likely in hospital death as pt is comfort care only   Level of care: Med-Surg Status is: Inpatient Remains inpatient appropriate because: comfort care only      Consultants:  Neuro   Procedures:   Antimicrobials:   Subjective: Pt is obtunded   Objective: Vitals:   04/01/23 1819 04/01/23 1900 04/01/23 2037 22-Apr-2023 0734  BP: (!) 117/106 (!) 67/46 (!) 89/47 (!) 82/47  Pulse: 87 60 90 97  Resp:   16 16  Temp: (!) 97.5 F (36.4 C)  98 F (36.7 C) 97.6 F (36.4 C)  TempSrc: Oral     SpO2: 100%  100% 97%  Height:        Intake/Output Summary (Last 24 hours) at 04/09/2023 5366 Last data filed at 04/01/2023 4403 Gross per 24 hour  Intake --  Output 350 ml  Net -350 ml   There were no vitals filed for this visit.  Examination:  General exam: appears comfortable  Respiratory system: diminished breath sounds  Cardiovascular system: S1/S2+ Gastrointestinal system: abd has hypoactive bowel sounds Central nervous system: obtunded  Psychiatry: unable to assess     Data Reviewed: I have personally reviewed following labs  and imaging studies  CBC: Recent Labs  Lab 04/16/23 1459 03/28/23 0227 03/29/23 0738 03/30/23 0623 03/31/23 0503 04/01/23 0628  WBC 10.5 12.3* 11.2* 10.8* 10.1 12.4*  NEUTROABS 9.1*  --   --   --   --   --   HGB 13.7 13.7 12.6 14.4 14.3 15.2*  HCT 40.9 40.5 37.9 42.0 42.1 47.2*  MCV 88.3 88.2 89.2 88.2 87.5 90.9  PLT 257 236 228 240 238 256   Basic Metabolic Panel: Recent Labs  Lab 03/28/23 0227 03/29/23 0738 03/30/23 0623 03/31/23 0503 04/01/23 0628  NA 136 140 143 143 148*  K 4.1 3.4* 3.5 3.1* 4.1  CL 103 107 109 107 115*  CO2 23 21* 23 23 20*  GLUCOSE 145* 98 127* 115* 117*  BUN 32* 42* 40* 43* 55*  CREATININE 0.81 0.78 0.74 0.68 0.79  CALCIUM 8.9 8.8* 9.0 8.8* 9.3   GFR: CrCl cannot be calculated (Unknown ideal weight.). Liver Function Tests: Recent Labs  Lab 16-Apr-2023 1459  AST 112*  ALT 56*  ALKPHOS 92  BILITOT 1.3*  PROT 7.3  ALBUMIN 3.6   No results for input(s): "LIPASE", "AMYLASE" in the last 168 hours. No results for input(s): "AMMONIA" in the last 168 hours. Coagulation Profile: Recent Labs  Lab 04/16/2023 1459  INR 1.1   Cardiac Enzymes: Recent Labs  Lab 03/28/23 0227 03/29/23 0737 03/30/23 0623 03/31/23 0503 04/01/23 0628  CKTOTAL 2,571* 1,462* 1,580* 898* 319*   BNP (last 3 results) No results for input(s): "PROBNP" in the last 8760 hours. HbA1C: No results for input(s): "HGBA1C" in the last 72 hours.  CBG: Recent Labs  Lab Apr 16, 2023 1459 04/01/23 1820  GLUCAP 120* 117*   Lipid Profile: No results for input(s): "CHOL", "HDL", "LDLCALC", "TRIG", "CHOLHDL", "LDLDIRECT" in the last 72 hours.  Thyroid Function Tests: No results for input(s): "TSH", "T4TOTAL", "FREET4", "T3FREE", "THYROIDAB" in the last 72 hours. Anemia Panel: No results for input(s): "VITAMINB12", "FOLATE", "FERRITIN", "TIBC", "IRON", "RETICCTPCT" in the last 72 hours. Sepsis Labs: No results for input(s): "PROCALCITON", "LATICACIDVEN" in the last 168  hours.  No results found for this or any previous visit (from the past 240 hour(s)).       Radiology Studies: No results found.      Scheduled Meds:   Continuous Infusions:     LOS: 6 days        Charise Killian, MD [facility] Pager [phone]  If 7PM-7AM, please contact night-coverage [email or web address] 03/19/2023, 8:08 AM

## 2023-04-12 NOTE — Progress Notes (Signed)
2023/04/18 1800  Spiritual Encounters  Type of Visit Initial  Care provided to: Family  Referral source Nurse (RN/NT/LPN)  Reason for visit End-of-life  OnCall Visit Yes  Spiritual Framework  Family Stress Factors Major life changes  Interventions  Spiritual Care Interventions Made Established relationship of care and support;Compassionate presence;Supported grief process  Intervention Outcomes  Outcomes Awareness of support  Spiritual Care Plan  Spiritual Care Issues Still Outstanding No further spiritual care needs at this time (see row info)   Received phone call about passing of patient family was present but did not need anything. This is what I was told by the family while visiting the room. I let them know if anything changes spiritual care is here.

## 2023-04-12 NOTE — TOC Progression Note (Signed)
Transition of Care [facility]) - Progression Note    Patient Details  Name: Nancy Joyce MRN: [id number] Date of Birth: [birth year]  Transition of Care [facility] CM/SW Contact  Garret Reddish, RN Phone Number: 03/22/2023, 10:52 AM  Clinical Narrative:    Chart reviewed.  I spoken to Lafonda Mosses about discharge planning.  Lafonda Mosses informs me that patient has been made Comfort Care at this time.  Noted that patient with GI bleed and hemorrhagic shock.    TOC will continue to follow and assist with discharge planning.     Expected Discharge Plan: Skilled Nursing Facility Barriers to Discharge: Continued Medical Work up  Expected Discharge Plan and Services       Living arrangements for the past 2 months: Single Family Home                                       Social Determinants of Health (SDOH) Interventions SDOH Screenings   Food Insecurity: No Food Insecurity (03/28/2023)  Housing: Low Risk  (03/28/2023)  Transportation Needs: No Transportation Needs (03/28/2023)  Utilities: Not At Risk (03/28/2023)  Tobacco Use: Low Risk  (04-05-23)    Readmission Risk Interventions     No data to display

## 2023-04-12 NOTE — Progress Notes (Signed)
PT Cancellation Note  Patient Details Name: Nancy Joyce MRN: [id number] DOB: [birth year]   Cancelled Treatment:    Reason Eval/Treat Not Completed: Other (comment).  Chart reviewed and pt has been placed on comfort care only.  PT will sign-off.  Please re-consult if therapy services are needed.   Nolon Bussing, PT, DPT Physical Therapist - [facility]  03/15/2023, 9:48 AM

## 2023-04-12 NOTE — Death Summary Note (Signed)
Death Summary  Nancy Joyce ZOX:[id number] DOB: Sep 08, 1926 DOA: 16-Apr-2023  PCP: Rolm Gala, MD\  Admit date: April 16, 2023 Date of Death: 04/22/2023  Final Diagnoses:  Principal Problem:   Syncope Active Problems:   Rhabdomyolysis   Elevated troponin   Acute right PCA stroke (HCC)   Sundowning   Memory impairment   CVA (cerebral vascular accident) (HCC)   Lower GI bleed   Hemorrhagic shock (HCC)     History of present illness:  HPI was taken from Dr. Sedalia Muta: Nancy Joyce is a 89 year old female with history of hypertension who presents emergency department for chief concerns of unwitnessed fall in the shower.   Unknown as to how long she was laying on the floor.   Initial vitals in the ED showed temperature of 98, respiration rate of 16, heart rate of 71, blood pressure 109/54, SpO2 98% on room air.   Serum sodium is 132, potassium 4.2, chloride 98, bicarb 25, BUN of 36, serum creatinine of 2.82, EGFR greater than 60, nonfasting glucose 133, WBC 10.5, hemoglobin 13.7, platelets of 257.   CK is elevated at 2445.   CT head and cervical spine without contrast: Was read as acute to subacute right PCA infarct.  No acute intracranial hemorrhage.  Moderate chronic small vessel ischemic disease.  No acute cervical spine fracture.   ED treatment: Sodium chloride 1 L bolus. ---------------------------- At bedside, patient awake and not alert to self, age, location, current calendar year.   Patient appears to be sundowning.  Patient is agitated and confused and yelling at staff and myself.   Social history: At home on her own.  Son denies history of tobacco, EtOH, recreational drug use.  She formally smoked cigarettes probably more than 50 years ago.  Patient is retired.     As per Dr. Mayford Knife 10/17-Oct 04, 2022: Pt was found to have a CVA on admission and pt was started on aspirin and plavix as per neuro. Statin was held secondary to rhabdomyolysis. Unfortunately, pt had a  likely lower GI bleed on 04/01/23 and after a discussion w/ pt's MPOA, Nancy Joyce, pt was made comfort care only.   Unfortunately, pt passed away at 1806 on 22-Apr-2023.   Hospital Course:   Failure to thrive: secondary to all below. Continue w/ comfort care only    Likely GI bleed: etiology unclear, possibly lower GI bleed. After discussion w/ the pt MPOA, pt's son, Nancy Joyce. Pt was made comfort care only.    Acute right PCA CVA: as per CT head & MRI brain. D/c aspirin, plavix. D/c statin. Echo shows EF 60-65%, grade I diastolic dysfunction & no atrial shunt is detected. Will need f/u outpatient w/ neuro. Neuro signed ouff    Syncope: likely secondary to CVA.    Memory impairment: has been worsening over the the last year as per pt's son. ?Dementia. Haldol prn     Elevated troponin: likely secondary to demand ischemia    Rhabdomyolysis: likely secondary to unwitnessed fall/syncope at home. Comfort care only   Time of death: 04-28-05  Signed:  Charise Killian  [facility] 04/22/2023, 7:52 PM

## 2023-04-12 DEATH — deceased
